# Patient Record
Sex: Male | Born: 1985 | Race: Black or African American | Hispanic: No | Marital: Single | Smoking: Current every day smoker
Health system: Southern US, Community
[De-identification: ages and names within clinical notes are randomized; demographics above are authoritative.]

## PROBLEM LIST (undated history)

## (undated) DIAGNOSIS — Y249XXA Unspecified firearm discharge, undetermined intent, initial encounter: Secondary | ICD-10-CM

## (undated) DIAGNOSIS — J45909 Unspecified asthma, uncomplicated: Secondary | ICD-10-CM

## (undated) DIAGNOSIS — S02609A Fracture of mandible, unspecified, initial encounter for closed fracture: Secondary | ICD-10-CM

## (undated) DIAGNOSIS — W3400XA Accidental discharge from unspecified firearms or gun, initial encounter: Secondary | ICD-10-CM

## (undated) DIAGNOSIS — R011 Cardiac murmur, unspecified: Secondary | ICD-10-CM

## (undated) DIAGNOSIS — I10 Essential (primary) hypertension: Secondary | ICD-10-CM

## (undated) DIAGNOSIS — F1910 Other psychoactive substance abuse, uncomplicated: Secondary | ICD-10-CM

## (undated) HISTORY — DX: Other psychoactive substance abuse, uncomplicated: F19.10

## (undated) HISTORY — PX: FRACTURE SURGERY: SHX138

## (undated) HISTORY — DX: Cardiac murmur, unspecified: R01.1

## (undated) HISTORY — PX: ABDOMINAL SURGERY: SHX537

---

## 2012-12-29 ENCOUNTER — Emergency Department (HOSPITAL_COMMUNITY): Payer: Medicaid - Out of State

## 2012-12-29 ENCOUNTER — Emergency Department (HOSPITAL_COMMUNITY)
Admission: EM | Admit: 2012-12-29 | Discharge: 2012-12-29 | Disposition: A | Discharge status: Home / Self Care | Payer: Medicaid - Out of State | Attending: Emergency Medicine | Admitting: Emergency Medicine

## 2012-12-29 ENCOUNTER — Encounter (HOSPITAL_COMMUNITY): Payer: Self-pay | Admitting: Emergency Medicine

## 2012-12-29 DIAGNOSIS — K089 Disorder of teeth and supporting structures, unspecified: Secondary | ICD-10-CM | POA: Insufficient documentation

## 2012-12-29 DIAGNOSIS — K0889 Other specified disorders of teeth and supporting structures: Secondary | ICD-10-CM

## 2012-12-29 DIAGNOSIS — Z8781 Personal history of (healed) traumatic fracture: Secondary | ICD-10-CM | POA: Insufficient documentation

## 2012-12-29 DIAGNOSIS — K047 Periapical abscess without sinus: Secondary | ICD-10-CM | POA: Insufficient documentation

## 2012-12-29 DIAGNOSIS — J45909 Unspecified asthma, uncomplicated: Secondary | ICD-10-CM | POA: Insufficient documentation

## 2012-12-29 DIAGNOSIS — R51 Headache: Secondary | ICD-10-CM | POA: Insufficient documentation

## 2012-12-29 DIAGNOSIS — F17200 Nicotine dependence, unspecified, uncomplicated: Secondary | ICD-10-CM | POA: Insufficient documentation

## 2012-12-29 HISTORY — DX: Fracture of mandible, unspecified, initial encounter for closed fracture: S02.609A

## 2012-12-29 HISTORY — DX: Unspecified asthma, uncomplicated: J45.909

## 2012-12-29 HISTORY — DX: Accidental discharge from unspecified firearms or gun, initial encounter: W34.00XA

## 2012-12-29 HISTORY — DX: Unspecified firearm discharge, undetermined intent, initial encounter: Y24.9XXA

## 2012-12-29 MED ORDER — OXYCODONE-ACETAMINOPHEN 5-325 MG PO TABS
2.0000 | ORAL_TABLET | Freq: Once | ORAL | Status: AC
  Administered 2012-12-29: 2 via ORAL
  Filled 2012-12-29: qty 2

## 2012-12-29 MED ORDER — CLINDAMYCIN HCL 300 MG PO CAPS: 300.0000 mg | ORAL_CAPSULE | Freq: Four times a day (QID) | ORAL | Status: DC

## 2012-12-29 MED ORDER — OXYCODONE-ACETAMINOPHEN 5-325 MG PO TABS: 1.0000 | ORAL_TABLET | Freq: Four times a day (QID) | ORAL | Status: DC | PRN
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
## 2012-12-30 NOTE — ED Provider Notes (Signed)
Medical screening examination/treatment/procedure(s) were performed by non-physician practitioner and as supervising physician I was immediately available for consultation/collaboration.   Lyanne Co, MD 12/30/12 [phone]

## 2013-03-20 ENCOUNTER — Encounter (HOSPITAL_COMMUNITY): Payer: Self-pay | Admitting: *Deleted

## 2013-03-20 ENCOUNTER — Emergency Department (HOSPITAL_COMMUNITY)
Admission: EM | Admit: 2013-03-20 | Discharge: 2013-03-20 | Disposition: A | Discharge status: Home / Self Care | Payer: Medicaid - Out of State | Source: Home / Self Care | Attending: Emergency Medicine | Admitting: Emergency Medicine

## 2013-03-20 ENCOUNTER — Emergency Department (HOSPITAL_COMMUNITY): Payer: Medicaid - Out of State

## 2013-03-20 ENCOUNTER — Encounter (HOSPITAL_COMMUNITY): Payer: Self-pay | Admitting: Emergency Medicine

## 2013-03-20 ENCOUNTER — Emergency Department (HOSPITAL_COMMUNITY)
Admission: EM | Admit: 2013-03-20 | Discharge: 2013-03-21 | Disposition: A | Discharge status: Home / Self Care | Payer: Medicaid - Out of State | Attending: Emergency Medicine | Admitting: Emergency Medicine

## 2013-03-20 DIAGNOSIS — J45909 Unspecified asthma, uncomplicated: Secondary | ICD-10-CM | POA: Insufficient documentation

## 2013-03-20 DIAGNOSIS — T148 Other injury of unspecified body region: Secondary | ICD-10-CM | POA: Insufficient documentation

## 2013-03-20 DIAGNOSIS — S93401A Sprain of unspecified ligament of right ankle, initial encounter: Secondary | ICD-10-CM

## 2013-03-20 DIAGNOSIS — Z87828 Personal history of other (healed) physical injury and trauma: Secondary | ICD-10-CM | POA: Insufficient documentation

## 2013-03-20 DIAGNOSIS — F17200 Nicotine dependence, unspecified, uncomplicated: Secondary | ICD-10-CM | POA: Insufficient documentation

## 2013-03-20 DIAGNOSIS — Y939 Activity, unspecified: Secondary | ICD-10-CM | POA: Insufficient documentation

## 2013-03-20 DIAGNOSIS — L02519 Cutaneous abscess of unspecified hand: Secondary | ICD-10-CM | POA: Insufficient documentation

## 2013-03-20 DIAGNOSIS — Z8781 Personal history of (healed) traumatic fracture: Secondary | ICD-10-CM | POA: Insufficient documentation

## 2013-03-20 DIAGNOSIS — L089 Local infection of the skin and subcutaneous tissue, unspecified: Secondary | ICD-10-CM | POA: Insufficient documentation

## 2013-03-20 DIAGNOSIS — L0390 Cellulitis, unspecified: Secondary | ICD-10-CM

## 2013-03-20 DIAGNOSIS — L03119 Cellulitis of unspecified part of limb: Secondary | ICD-10-CM | POA: Insufficient documentation

## 2013-03-20 DIAGNOSIS — Y929 Unspecified place or not applicable: Secondary | ICD-10-CM | POA: Insufficient documentation

## 2013-03-20 LAB — CBC WITH DIFFERENTIAL/PLATELET
Basophils Absolute: 0 10*3/uL (ref 0.0–0.1)
Basophils Relative: 1 % (ref 0–1)
MCHC: 33.9 g/dL (ref 30.0–36.0)
Monocytes Absolute: 0.5 10*3/uL (ref 0.1–1.0)
Neutro Abs: 3.8 10*3/uL (ref 1.7–7.7)
Neutrophils Relative %: 54 % (ref 43–77)
Platelets: 301 10*3/uL (ref 150–400)
RDW: 11.9 % (ref 11.5–15.5)

## 2013-03-20 LAB — BASIC METABOLIC PANEL
Chloride: 101 mEq/L (ref 96–112)
Creatinine, Ser: 0.66 mg/dL (ref 0.50–1.35)
GFR calc Af Amer: >90 mL/min (ref >90)
Potassium: 3.5 mEq/L (ref 3.5–5.1)
Sodium: 137 mEq/L (ref 135–145)

## 2013-03-20 MED ORDER — ONDANSETRON HCL 4 MG/2ML IJ SOLN
4.0000 mg | Freq: Once | INTRAMUSCULAR | Status: AC
  Administered 2013-03-20: 4 mg via INTRAVENOUS
  Filled 2013-03-20: qty 2

## 2013-03-20 MED ORDER — SODIUM CHLORIDE 0.9 % IV BOLUS (SEPSIS)
1000.0000 mL | Freq: Once | INTRAVENOUS | Status: AC
  Administered 2013-03-20: 1000 mL via INTRAVENOUS

## 2013-03-20 MED ORDER — SULFAMETHOXAZOLE-TRIMETHOPRIM 800-160 MG PO TABS: 1.0000 | ORAL_TABLET | Freq: Two times a day (BID) | ORAL | Status: DC

## 2013-03-20 MED ORDER — CLINDAMYCIN PHOSPHATE 900 MG/50ML IV SOLN
900.0000 mg | Freq: Once | INTRAVENOUS | Status: AC
  Administered 2013-03-20: 900 mg via INTRAVENOUS
  Filled 2013-03-20: qty 50

## 2013-03-20 MED ORDER — HYDROCODONE-ACETAMINOPHEN 5-325 MG PO TABS: 1.0000 | ORAL_TABLET | ORAL | Status: DC | PRN

## 2013-03-20 MED ORDER — HYDROCODONE-ACETAMINOPHEN 5-325 MG PO TABS
2.0000 | ORAL_TABLET | Freq: Once | ORAL | Status: AC
  Administered 2013-03-20: 2 via ORAL
  Filled 2013-03-20: qty 2

## 2013-03-20 MED ORDER — MORPHINE SULFATE 4 MG/ML IJ SOLN
4.0000 mg | Freq: Once | INTRAMUSCULAR | Status: AC
  Administered 2013-03-20: 4 mg via INTRAVENOUS
  Filled 2013-03-20: qty 1

## 2013-03-20 NOTE — Progress Notes (Signed)
Orthopedic Tech Progress Note Patient Details:  Shawn Arnold Feb 20, 1986 [id number]  Ortho Devices Type of Ortho Device: ASO Ortho Device/Splint Location: right ankle Ortho Device/Splint Interventions: Application   Shawn Arnold 03/20/2013, 5:10 PM
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
## 2013-03-21 ENCOUNTER — Emergency Department (HOSPITAL_COMMUNITY): Payer: Self-pay

## 2013-03-21 ENCOUNTER — Encounter (HOSPITAL_COMMUNITY): Payer: Self-pay | Admitting: Emergency Medicine

## 2013-03-21 ENCOUNTER — Emergency Department (HOSPITAL_COMMUNITY)
Admission: EM | Admit: 2013-03-21 | Discharge: 2013-03-21 | Disposition: A | Discharge status: Home / Self Care | Payer: Self-pay | Attending: Emergency Medicine | Admitting: Emergency Medicine

## 2013-03-21 DIAGNOSIS — L03114 Cellulitis of left upper limb: Secondary | ICD-10-CM

## 2013-03-21 DIAGNOSIS — R229 Localized swelling, mass and lump, unspecified: Secondary | ICD-10-CM | POA: Insufficient documentation

## 2013-03-21 DIAGNOSIS — J45909 Unspecified asthma, uncomplicated: Secondary | ICD-10-CM | POA: Insufficient documentation

## 2013-03-21 DIAGNOSIS — Y9289 Other specified places as the place of occurrence of the external cause: Secondary | ICD-10-CM | POA: Insufficient documentation

## 2013-03-21 DIAGNOSIS — Z8781 Personal history of (healed) traumatic fracture: Secondary | ICD-10-CM | POA: Insufficient documentation

## 2013-03-21 DIAGNOSIS — F17200 Nicotine dependence, unspecified, uncomplicated: Secondary | ICD-10-CM | POA: Insufficient documentation

## 2013-03-21 DIAGNOSIS — IMO0001 Reserved for inherently not codable concepts without codable children: Secondary | ICD-10-CM | POA: Insufficient documentation

## 2013-03-21 DIAGNOSIS — IMO0002 Reserved for concepts with insufficient information to code with codable children: Secondary | ICD-10-CM | POA: Insufficient documentation

## 2013-03-21 DIAGNOSIS — Y9389 Activity, other specified: Secondary | ICD-10-CM | POA: Insufficient documentation

## 2013-03-21 DIAGNOSIS — Z87828 Personal history of other (healed) physical injury and trauma: Secondary | ICD-10-CM | POA: Insufficient documentation

## 2013-03-21 MED ORDER — OXYCODONE-ACETAMINOPHEN 5-325 MG PO TABS
1.0000 | ORAL_TABLET | Freq: Once | ORAL | Status: DC
  Filled 2013-03-21: qty 1

## 2013-03-21 MED ORDER — CLINDAMYCIN HCL 150 MG PO CAPS: ORAL_CAPSULE | ORAL | Status: DC

## 2013-03-21 MED ORDER — OXYCODONE-ACETAMINOPHEN 5-325 MG PO TABS
2.0000 | ORAL_TABLET | Freq: Once | ORAL | Status: AC
  Administered 2013-03-21: 2 via ORAL
  Filled 2013-03-21: qty 1

## 2013-03-21 MED ORDER — CLINDAMYCIN PHOSPHATE 900 MG/50ML IV SOLN
900.0000 mg | Freq: Once | INTRAVENOUS | Status: AC
Start: 2013-03-21 — End: 2013-03-21
  Administered 2013-03-21: 900 mg via INTRAVENOUS
  Filled 2013-03-21: qty 50

## 2013-03-21 NOTE — ED Notes (Signed)
Patient to Xray as soon as he was roomed.
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
## 2013-03-21 NOTE — ED Notes (Signed)
Left forearm swollen and hot

## 2013-03-26 ENCOUNTER — Emergency Department (HOSPITAL_COMMUNITY)
Admission: EM | Admit: 2013-03-26 | Discharge: 2013-03-27 | Disposition: A | Discharge status: Home / Self Care | Payer: Medicaid - Out of State | Attending: Emergency Medicine | Admitting: Emergency Medicine

## 2013-03-26 ENCOUNTER — Encounter (HOSPITAL_COMMUNITY): Payer: Self-pay | Admitting: Emergency Medicine

## 2013-03-26 DIAGNOSIS — J45909 Unspecified asthma, uncomplicated: Secondary | ICD-10-CM | POA: Insufficient documentation

## 2013-03-26 DIAGNOSIS — L089 Local infection of the skin and subcutaneous tissue, unspecified: Secondary | ICD-10-CM | POA: Insufficient documentation

## 2013-03-26 DIAGNOSIS — Y929 Unspecified place or not applicable: Secondary | ICD-10-CM | POA: Insufficient documentation

## 2013-03-26 DIAGNOSIS — Y939 Activity, unspecified: Secondary | ICD-10-CM | POA: Insufficient documentation

## 2013-03-26 DIAGNOSIS — L03119 Cellulitis of unspecified part of limb: Secondary | ICD-10-CM | POA: Insufficient documentation

## 2013-03-26 DIAGNOSIS — F17200 Nicotine dependence, unspecified, uncomplicated: Secondary | ICD-10-CM | POA: Insufficient documentation

## 2013-03-26 DIAGNOSIS — Z8781 Personal history of (healed) traumatic fracture: Secondary | ICD-10-CM | POA: Insufficient documentation

## 2013-03-26 DIAGNOSIS — L02419 Cutaneous abscess of limb, unspecified: Secondary | ICD-10-CM | POA: Insufficient documentation

## 2013-03-26 DIAGNOSIS — Z87828 Personal history of other (healed) physical injury and trauma: Secondary | ICD-10-CM | POA: Insufficient documentation

## 2013-03-26 NOTE — ED Notes (Addendum)
Reports ? Insect bite to posterior R lower leg since earlier today. States it has now turned into a blister.

## 2013-03-27 MED ORDER — CLINDAMYCIN HCL 150 MG PO CAPS: 300.0000 mg | ORAL_CAPSULE | Freq: Four times a day (QID) | ORAL | Status: DC

## 2013-03-27 MED ORDER — CLINDAMYCIN HCL 150 MG PO CAPS: 150.0000 mg | ORAL_CAPSULE | Freq: Four times a day (QID) | ORAL | Status: DC

## 2013-03-27 MED ORDER — CLINDAMYCIN HCL 150 MG PO CAPS
300.0000 mg | ORAL_CAPSULE | Freq: Once | ORAL | Status: AC
  Administered 2013-03-27: 300 mg via ORAL
  Filled 2013-03-27: qty 2

## 2013-03-27 MED ORDER — ONDANSETRON HCL 4 MG PO TABS: 4.0000 mg | ORAL_TABLET | Freq: Four times a day (QID) | ORAL | Status: DC
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
## 2013-03-27 NOTE — ED Provider Notes (Signed)
Medical screening examination/treatment/procedure(s) were performed by non-physician practitioner and as supervising physician I was immediately available for consultation/collaboration.   Julie Manly, MD 03/27/13 0809 

## 2013-03-31 ENCOUNTER — Encounter (HOSPITAL_COMMUNITY): Payer: Self-pay | Admitting: Emergency Medicine

## 2013-03-31 ENCOUNTER — Emergency Department (HOSPITAL_COMMUNITY): Payer: Medicaid - Out of State

## 2013-03-31 ENCOUNTER — Emergency Department (HOSPITAL_COMMUNITY)
Admission: EM | Admit: 2013-03-31 | Discharge: 2013-03-31 | Disposition: A | Discharge status: Home / Self Care | Payer: Medicaid - Out of State | Attending: Emergency Medicine | Admitting: Emergency Medicine

## 2013-03-31 DIAGNOSIS — L03211 Cellulitis of face: Secondary | ICD-10-CM | POA: Insufficient documentation

## 2013-03-31 DIAGNOSIS — Z87828 Personal history of other (healed) physical injury and trauma: Secondary | ICD-10-CM | POA: Insufficient documentation

## 2013-03-31 DIAGNOSIS — K029 Dental caries, unspecified: Secondary | ICD-10-CM | POA: Insufficient documentation

## 2013-03-31 DIAGNOSIS — K089 Disorder of teeth and supporting structures, unspecified: Secondary | ICD-10-CM | POA: Insufficient documentation

## 2013-03-31 DIAGNOSIS — Z8781 Personal history of (healed) traumatic fracture: Secondary | ICD-10-CM | POA: Insufficient documentation

## 2013-03-31 DIAGNOSIS — L0201 Cutaneous abscess of face: Secondary | ICD-10-CM | POA: Insufficient documentation

## 2013-03-31 DIAGNOSIS — J45909 Unspecified asthma, uncomplicated: Secondary | ICD-10-CM | POA: Insufficient documentation

## 2013-03-31 DIAGNOSIS — F17200 Nicotine dependence, unspecified, uncomplicated: Secondary | ICD-10-CM | POA: Insufficient documentation

## 2013-03-31 DIAGNOSIS — K0889 Other specified disorders of teeth and supporting structures: Secondary | ICD-10-CM

## 2013-03-31 MED ORDER — PROMETHAZINE HCL 25 MG PO TABS: 25.0000 mg | ORAL_TABLET | Freq: Four times a day (QID) | ORAL | Status: DC | PRN

## 2013-03-31 MED ORDER — OXYCODONE-ACETAMINOPHEN 5-325 MG PO TABS: 2.0000 | ORAL_TABLET | ORAL | Status: DC | PRN

## 2013-03-31 MED ORDER — PENICILLIN V POTASSIUM 250 MG PO TABS
500.0000 mg | ORAL_TABLET | Freq: Once | ORAL | Status: AC
  Administered 2013-03-31: 500 mg via ORAL
  Filled 2013-03-31: qty 2

## 2013-03-31 MED ORDER — OXYCODONE-ACETAMINOPHEN 5-325 MG PO TABS
2.0000 | ORAL_TABLET | Freq: Once | ORAL | Status: AC
  Administered 2013-03-31: 2 via ORAL
  Filled 2013-03-31: qty 2

## 2013-03-31 MED ORDER — MORPHINE SULFATE 4 MG/ML IJ SOLN
4.0000 mg | Freq: Once | INTRAMUSCULAR | Status: AC
  Administered 2013-03-31: 4 mg via INTRAVENOUS
  Filled 2013-03-31: qty 1

## 2013-03-31 MED ORDER — PENICILLIN V POTASSIUM 500 MG PO TABS: 500.0000 mg | ORAL_TABLET | Freq: Four times a day (QID) | ORAL | Status: DC

## 2013-03-31 MED ORDER — IOHEXOL 300 MG/ML  SOLN
80.0000 mL | Freq: Once | INTRAMUSCULAR | Status: AC | PRN
  Administered 2013-03-31: 80 mL via INTRAVENOUS

## 2013-03-31 MED ORDER — ONDANSETRON HCL 4 MG/2ML IJ SOLN
4.0000 mg | Freq: Once | INTRAMUSCULAR | Status: AC
  Administered 2013-03-31: 4 mg via INTRAVENOUS
  Filled 2013-03-31: qty 2

## 2013-03-31 NOTE — ED Notes (Signed)
L lower toothache x 1 week with swelling over the past 2 days.
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
## 2013-04-02 NOTE — ED Provider Notes (Signed)
Medical screening examination/treatment/procedure(s) were performed by non-physician practitioner and as supervising physician I was immediately available for consultation/collaboration.   Laray Anger, DO 04/02/13 1436

## 2013-09-26 ENCOUNTER — Emergency Department (HOSPITAL_COMMUNITY)
Admission: EM | Admit: 2013-09-26 | Discharge: 2013-09-26 | Disposition: A | Discharge status: Home / Self Care | Payer: Medicaid - Out of State | Attending: Emergency Medicine | Admitting: Emergency Medicine

## 2013-09-26 ENCOUNTER — Emergency Department (HOSPITAL_COMMUNITY): Payer: Medicaid - Out of State

## 2013-09-26 ENCOUNTER — Encounter (HOSPITAL_COMMUNITY): Payer: Self-pay | Admitting: Emergency Medicine

## 2013-09-26 DIAGNOSIS — S60221A Contusion of right hand, initial encounter: Secondary | ICD-10-CM

## 2013-09-26 DIAGNOSIS — Z8781 Personal history of (healed) traumatic fracture: Secondary | ICD-10-CM | POA: Insufficient documentation

## 2013-09-26 DIAGNOSIS — IMO0002 Reserved for concepts with insufficient information to code with codable children: Secondary | ICD-10-CM | POA: Insufficient documentation

## 2013-09-26 DIAGNOSIS — F17200 Nicotine dependence, unspecified, uncomplicated: Secondary | ICD-10-CM | POA: Insufficient documentation

## 2013-09-26 DIAGNOSIS — J45909 Unspecified asthma, uncomplicated: Secondary | ICD-10-CM | POA: Insufficient documentation

## 2013-09-26 DIAGNOSIS — S60229A Contusion of unspecified hand, initial encounter: Secondary | ICD-10-CM | POA: Insufficient documentation
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
## 2013-09-26 NOTE — ED Notes (Signed)
Pharmacy at bedside

## 2013-09-26 NOTE — ED Notes (Signed)
Patient transported to X-ray 
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
## 2013-09-27 NOTE — ED Provider Notes (Signed)
Medical screening examination/treatment/procedure(s) were performed by non-physician practitioner and as supervising physician I was immediately available for consultation/collaboration.  EKG Interpretation   None         Jamaia Brum, MD 09/27/13 0729 

## 2013-10-23 ENCOUNTER — Encounter (HOSPITAL_COMMUNITY): Payer: Self-pay | Admitting: Emergency Medicine

## 2013-10-23 ENCOUNTER — Emergency Department (HOSPITAL_COMMUNITY): Payer: Medicaid - Out of State

## 2013-10-23 DIAGNOSIS — J45909 Unspecified asthma, uncomplicated: Secondary | ICD-10-CM | POA: Insufficient documentation

## 2013-10-23 DIAGNOSIS — S8990XA Unspecified injury of unspecified lower leg, initial encounter: Secondary | ICD-10-CM | POA: Insufficient documentation

## 2013-10-23 DIAGNOSIS — Y939 Activity, unspecified: Secondary | ICD-10-CM | POA: Insufficient documentation

## 2013-10-23 DIAGNOSIS — Y929 Unspecified place or not applicable: Secondary | ICD-10-CM | POA: Insufficient documentation

## 2013-10-23 DIAGNOSIS — R209 Unspecified disturbances of skin sensation: Secondary | ICD-10-CM | POA: Insufficient documentation

## 2013-10-23 DIAGNOSIS — Z8781 Personal history of (healed) traumatic fracture: Secondary | ICD-10-CM | POA: Insufficient documentation

## 2013-10-23 DIAGNOSIS — W2209XA Striking against other stationary object, initial encounter: Secondary | ICD-10-CM | POA: Insufficient documentation

## 2013-10-23 DIAGNOSIS — F17200 Nicotine dependence, unspecified, uncomplicated: Secondary | ICD-10-CM | POA: Insufficient documentation

## 2013-10-23 NOTE — ED Notes (Signed)
Pt states that he woke up a couple of days ago and his right ankle was in pain. Pt states that he thought moving and walking on the right ankle would help with the pain. Pt ambulatory.

## 2013-10-24 ENCOUNTER — Emergency Department (HOSPITAL_COMMUNITY)
Admission: EM | Admit: 2013-10-24 | Discharge: 2013-10-24 | Disposition: A | Discharge status: Home / Self Care | Payer: Medicaid - Out of State | Attending: Emergency Medicine | Admitting: Emergency Medicine

## 2013-10-24 DIAGNOSIS — M25571 Pain in right ankle and joints of right foot: Secondary | ICD-10-CM

## 2013-10-24 MED ORDER — OXYCODONE-ACETAMINOPHEN 5-325 MG PO TABS
2.0000 | ORAL_TABLET | Freq: Once | ORAL | Status: AC
  Administered 2013-10-24: 2 via ORAL
  Filled 2013-10-24: qty 2

## 2013-10-24 MED ORDER — IBUPROFEN 600 MG PO TABS: 600.0000 mg | ORAL_TABLET | Freq: Four times a day (QID) | ORAL | Status: DC | PRN

## 2013-10-24 MED ORDER — TRAMADOL HCL 50 MG PO TABS: 50.0000 mg | ORAL_TABLET | Freq: Four times a day (QID) | ORAL | Status: DC | PRN

## 2013-10-24 NOTE — ED Provider Notes (Signed)
Medical screening examination/treatment/procedure(s) were performed by non-physician practitioner and as supervising physician I was immediately available for consultation/collaboration.  EKG Interpretation   None        Sunnie Nielsen, MD 10/24/13 [phone]
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
## 2014-02-20 ENCOUNTER — Encounter (HOSPITAL_COMMUNITY): Payer: Self-pay | Admitting: Emergency Medicine

## 2014-02-20 ENCOUNTER — Emergency Department (HOSPITAL_COMMUNITY)
Admission: EM | Admit: 2014-02-20 | Discharge: 2014-02-20 | Disposition: A | Discharge status: Home / Self Care | Payer: Medicaid - Out of State | Attending: Emergency Medicine | Admitting: Emergency Medicine

## 2014-02-20 DIAGNOSIS — K0889 Other specified disorders of teeth and supporting structures: Secondary | ICD-10-CM

## 2014-02-20 DIAGNOSIS — J45909 Unspecified asthma, uncomplicated: Secondary | ICD-10-CM | POA: Insufficient documentation

## 2014-02-20 DIAGNOSIS — R42 Dizziness and giddiness: Secondary | ICD-10-CM | POA: Insufficient documentation

## 2014-02-20 DIAGNOSIS — K029 Dental caries, unspecified: Secondary | ICD-10-CM | POA: Insufficient documentation

## 2014-02-20 DIAGNOSIS — Z8781 Personal history of (healed) traumatic fracture: Secondary | ICD-10-CM | POA: Insufficient documentation

## 2014-02-20 DIAGNOSIS — K089 Disorder of teeth and supporting structures, unspecified: Secondary | ICD-10-CM | POA: Insufficient documentation

## 2014-02-20 DIAGNOSIS — Z87828 Personal history of other (healed) physical injury and trauma: Secondary | ICD-10-CM | POA: Insufficient documentation

## 2014-02-20 DIAGNOSIS — F17200 Nicotine dependence, unspecified, uncomplicated: Secondary | ICD-10-CM | POA: Insufficient documentation

## 2014-02-20 DIAGNOSIS — I10 Essential (primary) hypertension: Secondary | ICD-10-CM | POA: Insufficient documentation

## 2014-02-20 HISTORY — DX: Essential (primary) hypertension: I10

## 2014-02-20 MED ORDER — PENICILLIN V POTASSIUM 500 MG PO TABS: 500.0000 mg | ORAL_TABLET | Freq: Four times a day (QID) | ORAL | Status: DC

## 2014-02-20 MED ORDER — HYDROCODONE-ACETAMINOPHEN 5-325 MG PO TABS: 1.0000 | ORAL_TABLET | Freq: Four times a day (QID) | ORAL | Status: DC | PRN
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
## 2014-02-20 NOTE — Discharge Instructions (Signed)
Return here as needed.  Over the dentist provided.  Rinse with warm water and peroxide 3 times a day

## 2014-02-20 NOTE — ED Provider Notes (Signed)
Medical screening examination/treatment/procedure(s) were performed by non-physician practitioner and as supervising physician I was immediately available for consultation/collaboration.   EKG Interpretation None      Karlis Cregg, MD, FACEP   Clayten Allcock L Tywan Siever, MD 02/20/14 2258 

## 2014-02-20 NOTE — ED Notes (Signed)
Received Pt from home c/o left upper toothache onset intermittent x 5 months, increased yesterday. Pt tried Motrin no relief.

## 2014-12-22 IMAGING — CR DG FOREARM 2V*L*
2 series · 2 of 2 positions shown · non-contrast
Comparison: None.

CLINICAL DATA: Forearm pain/swelling

LEFT FOREARM - 2 VIEW

[x forearm ap left]
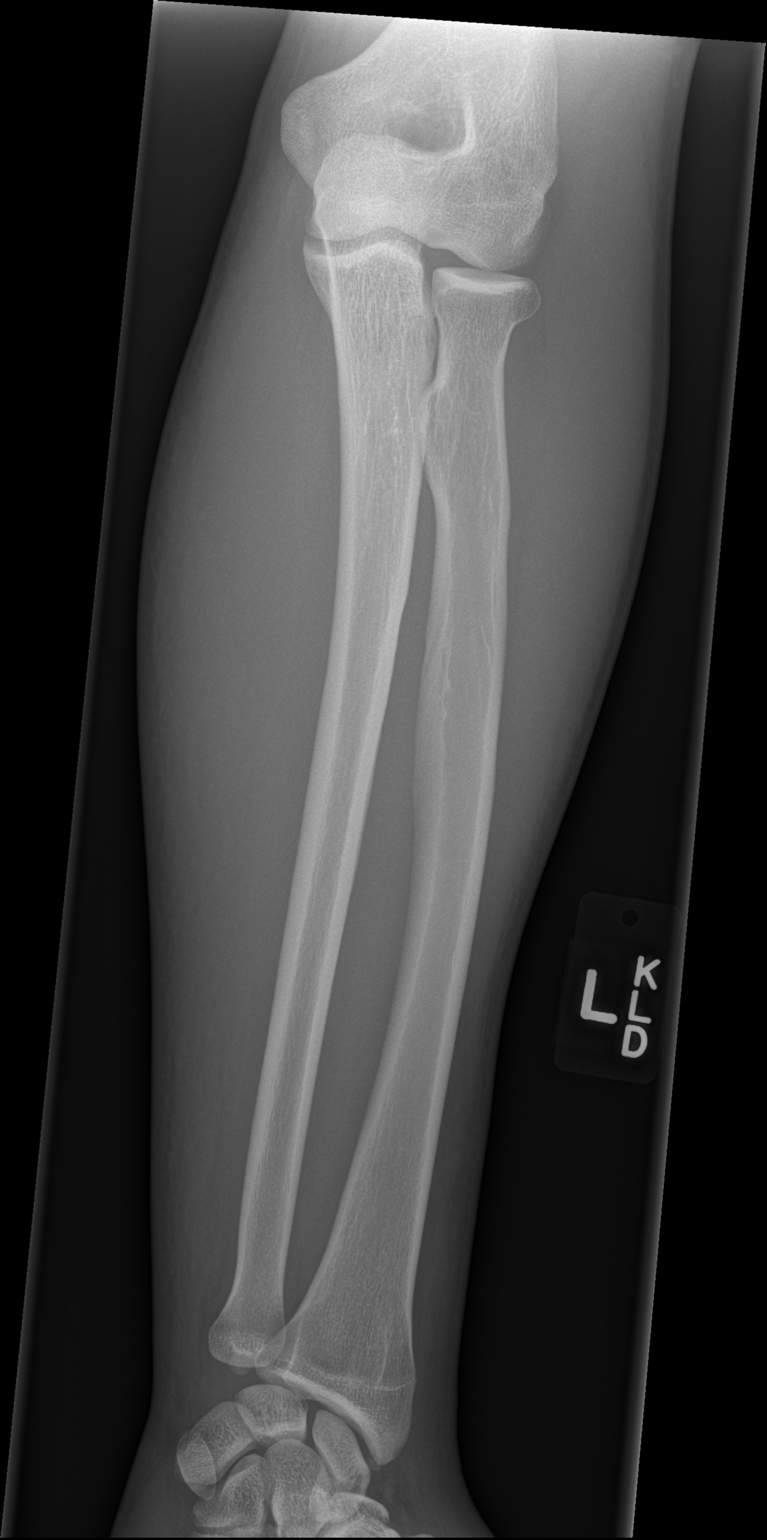

[x forearm lat left]
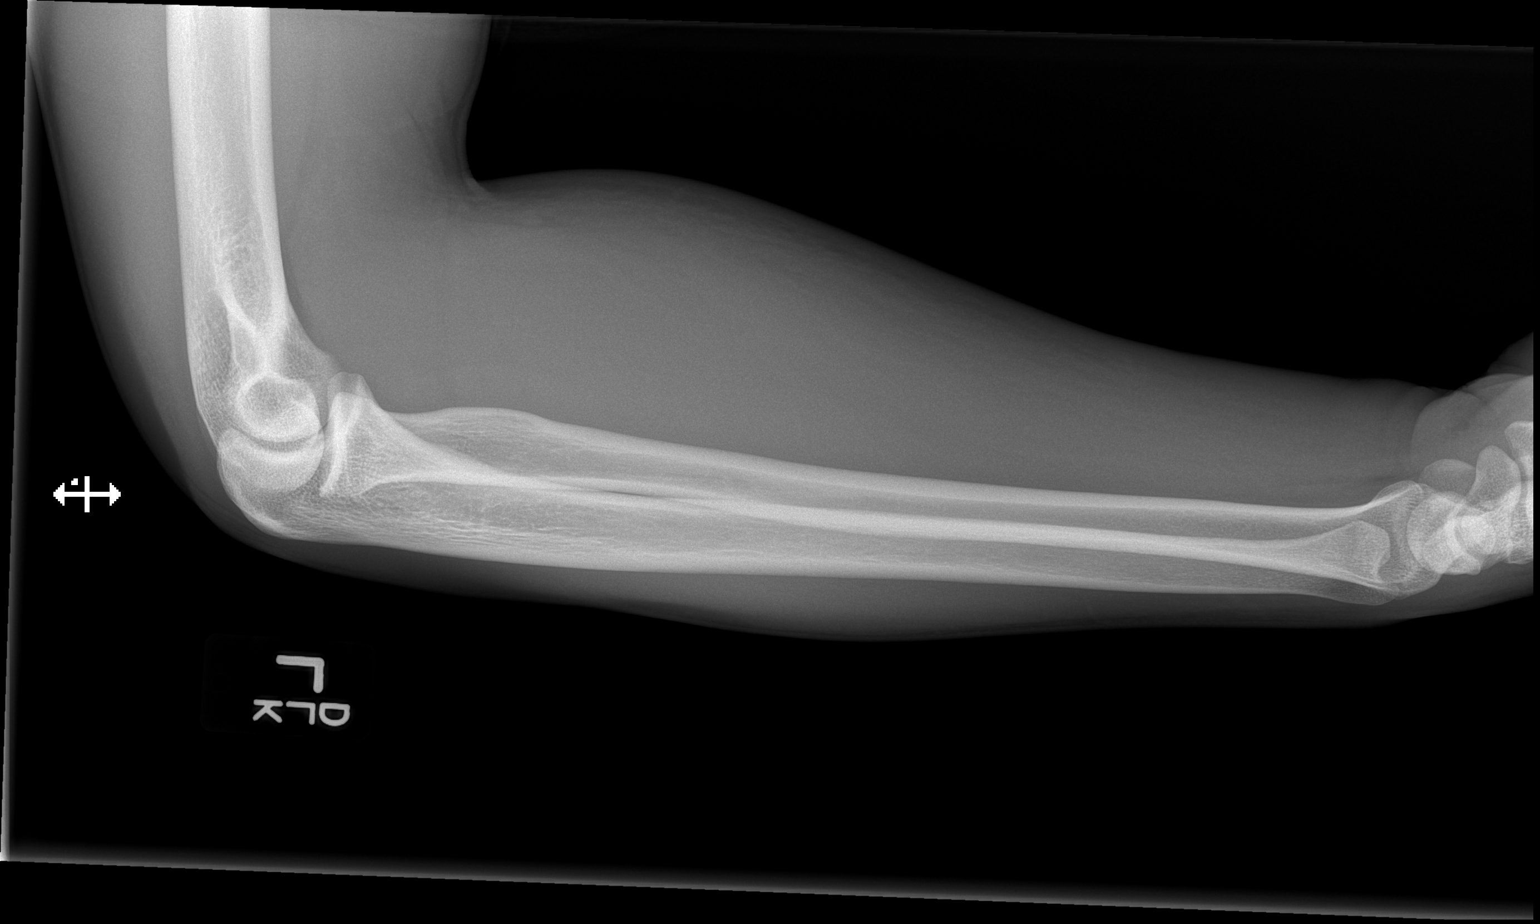

[2 of 2 positions shown; findings below may reference images not displayed]

FINDINGS: No fracture or dislocation is seen.

The joint spaces are preserved.

No radiopaque foreign body is seen.
IMPRESSION: No fracture, dislocation, or radiopaque foreign body is seen.

## 2014-12-25 ENCOUNTER — Encounter (HOSPITAL_COMMUNITY): Payer: Self-pay | Admitting: *Deleted

## 2014-12-25 ENCOUNTER — Emergency Department (HOSPITAL_COMMUNITY)
Admission: EM | Admit: 2014-12-25 | Discharge: 2014-12-25 | Disposition: A | Discharge status: Home / Self Care | Payer: Medicaid - Out of State | Attending: Emergency Medicine | Admitting: Emergency Medicine

## 2014-12-25 DIAGNOSIS — M545 Low back pain: Secondary | ICD-10-CM

## 2014-12-25 DIAGNOSIS — J45909 Unspecified asthma, uncomplicated: Secondary | ICD-10-CM | POA: Insufficient documentation

## 2014-12-25 DIAGNOSIS — Z720 Tobacco use: Secondary | ICD-10-CM | POA: Insufficient documentation

## 2014-12-25 DIAGNOSIS — M5440 Lumbago with sciatica, unspecified side: Secondary | ICD-10-CM | POA: Insufficient documentation

## 2014-12-25 DIAGNOSIS — Z8781 Personal history of (healed) traumatic fracture: Secondary | ICD-10-CM | POA: Insufficient documentation

## 2014-12-25 DIAGNOSIS — Z87828 Personal history of other (healed) physical injury and trauma: Secondary | ICD-10-CM | POA: Insufficient documentation

## 2014-12-25 DIAGNOSIS — I10 Essential (primary) hypertension: Secondary | ICD-10-CM | POA: Insufficient documentation

## 2014-12-25 MED ORDER — KETOROLAC TROMETHAMINE 60 MG/2ML IM SOLN
30.0000 mg | Freq: Once | INTRAMUSCULAR | Status: AC
  Administered 2014-12-25: 30 mg via INTRAMUSCULAR
  Filled 2014-12-25: qty 2

## 2014-12-25 MED ORDER — METHOCARBAMOL 500 MG PO TABS: 500.0000 mg | ORAL_TABLET | Freq: Two times a day (BID) | ORAL | Status: DC

## 2014-12-25 MED ORDER — DIAZEPAM 5 MG/ML IJ SOLN
5.0000 mg | Freq: Once | INTRAMUSCULAR | Status: AC
  Administered 2014-12-25: 5 mg via INTRAMUSCULAR
  Filled 2014-12-25: qty 2

## 2014-12-25 MED ORDER — TRAMADOL HCL 50 MG PO TABS: 50.0000 mg | ORAL_TABLET | Freq: Four times a day (QID) | ORAL | Status: DC | PRN

## 2014-12-25 NOTE — ED Notes (Signed)
Called jen, pa, to clarify dosage of toradol, patient is to receive 30mg  of toradol IM for pain management.

## 2014-12-25 NOTE — Discharge Instructions (Signed)
Please follow up with your primary care physician in 1-2 days. If you do not have one please call the [facility] number listed above. Please take pain medication and/or muscle relaxants as prescribed and as needed for pain. Please do not drive on narcotic pain medication or on muscle relaxants. Please read all discharge instructions and return precautions.  ° °Back Pain, Adult °Low back pain is very common. About 1 in 5 people have back pain. The cause of low back pain is rarely dangerous. The pain often gets better over time. About half of people with a sudden onset of back pain feel better in just 2 weeks. About 8 in 10 people feel better by 6 weeks.  °CAUSES °Some common causes of back pain include: °· Strain of the muscles or ligaments supporting the spine. °· Wear and tear (degeneration) of the spinal discs. °· Arthritis. °· Direct injury to the back. °DIAGNOSIS °Most of the time, the direct cause of low back pain is not known. However, back pain can be treated effectively even when the exact cause of the pain is unknown. Answering your caregiver's questions about your overall health and symptoms is one of the most accurate ways to make sure the cause of your pain is not dangerous. If your caregiver needs more information, he or she may order lab work or imaging tests (X-rays or MRIs). However, even if imaging tests show changes in your back, this usually does not require surgery. °HOME CARE INSTRUCTIONS °For many people, back pain returns. Since low back pain is rarely dangerous, it is often a condition that people can learn to manage on their own.  °· Remain active. It is stressful on the back to sit or stand in one place. Do not sit, drive, or stand in one place for more than 30 minutes at a time. Take short walks on level surfaces as soon as pain allows. Try to increase the length of time you walk each day. °· Do not stay in bed. Resting more than 1 or 2 days can delay your  recovery. °· Do not avoid exercise or work. Your body is made to move. It is not dangerous to be active, even though your back may hurt. Your back will likely heal faster if you return to being active before your pain is gone. °· Pay attention to your body when you  bend and lift. Many people have less discomfort when lifting if they bend their knees, keep the load close to their bodies, and avoid twisting. Often, the most comfortable positions are those that put less stress on your recovering back. °· Find a comfortable position to sleep. Use a firm mattress and lie on your side with your knees slightly bent. If you lie on your back, put a pillow under your knees. °· Only take over-the-counter or prescription medicines as directed by your caregiver. Over-the-counter medicines to reduce pain and inflammation are often the most helpful. Your caregiver may prescribe muscle relaxant drugs. These medicines help dull your pain so you can more quickly return to your normal activities and healthy exercise. °· Put ice on the injured area. °¨ Put ice in a plastic bag. °¨ Place a towel between your skin and the bag. °¨ Leave the ice on for 15-20 minutes, 03-04 times a day for the first 2 to 3 days. After that, ice and heat may be alternated to reduce pain and spasms. °· Ask your caregiver about trying back exercises and gentle massage. This may be of some benefit. °· Avoid feeling anxious or stressed. Stress increases muscle tension and   can worsen back pain. It is important to recognize when you are anxious or stressed and learn ways to manage it. Exercise is a great option. °SEEK MEDICAL CARE IF: °· You have pain that is not relieved with rest or medicine. °· You have pain that does not improve in 1 week. °· You have new symptoms. °· You are generally not feeling well. °SEEK IMMEDIATE MEDICAL CARE IF:  °· You have pain that radiates from your back into your legs. °· You develop new bowel or bladder control problems. °· You  have unusual weakness or numbness in your arms or legs. °· You develop nausea or vomiting. °· You develop abdominal pain. °· You feel faint. °Document Released: 10/14/2005 Document Revised: 04/14/2012 Document Reviewed: 02/15/2014 °[employer]® Patient Information ©2015 [employer]. This information is not intended to replace advice given to you by your health care provider. Make sure you discuss any questions you have with your health care provider. ° °
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
## 2015-06-29 IMAGING — CR DG HAND COMPLETE 3+V*R*
3 series · 3 of 3 positions shown · non-contrast
Comparison: None.

CLINICAL DATA: Left hand pain along 1st metatarsal

EXAM:
RIGHT HAND - COMPLETE 3+ VIEW

[x hand pa right]
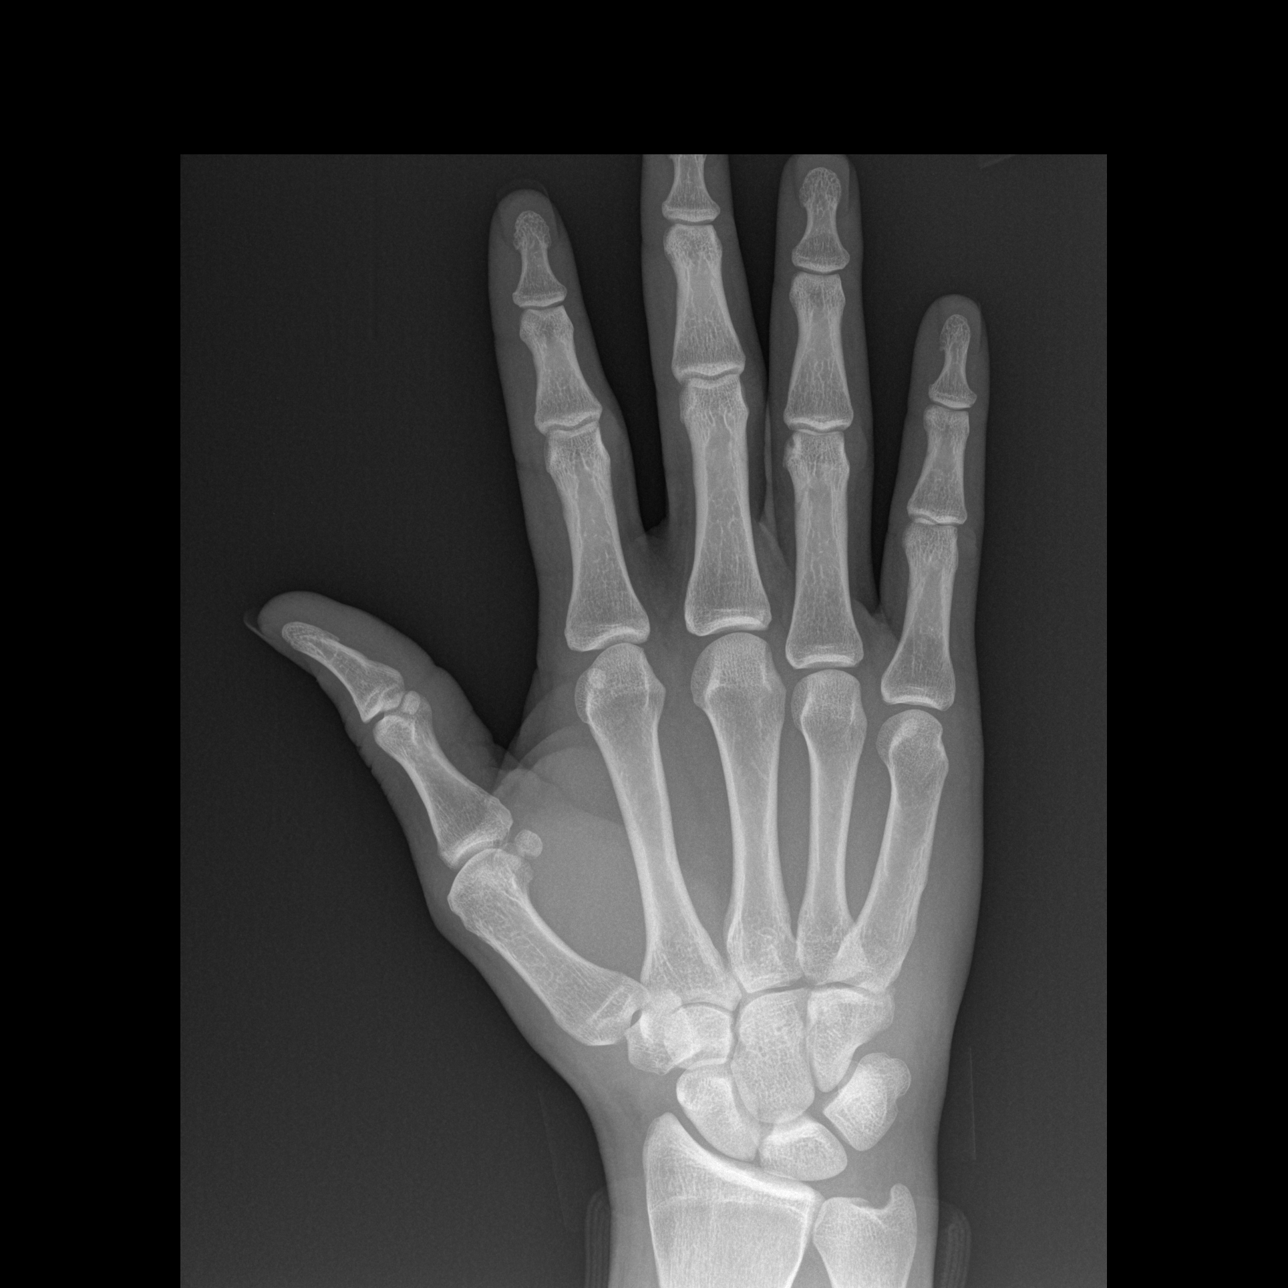

[x hand oblique right]
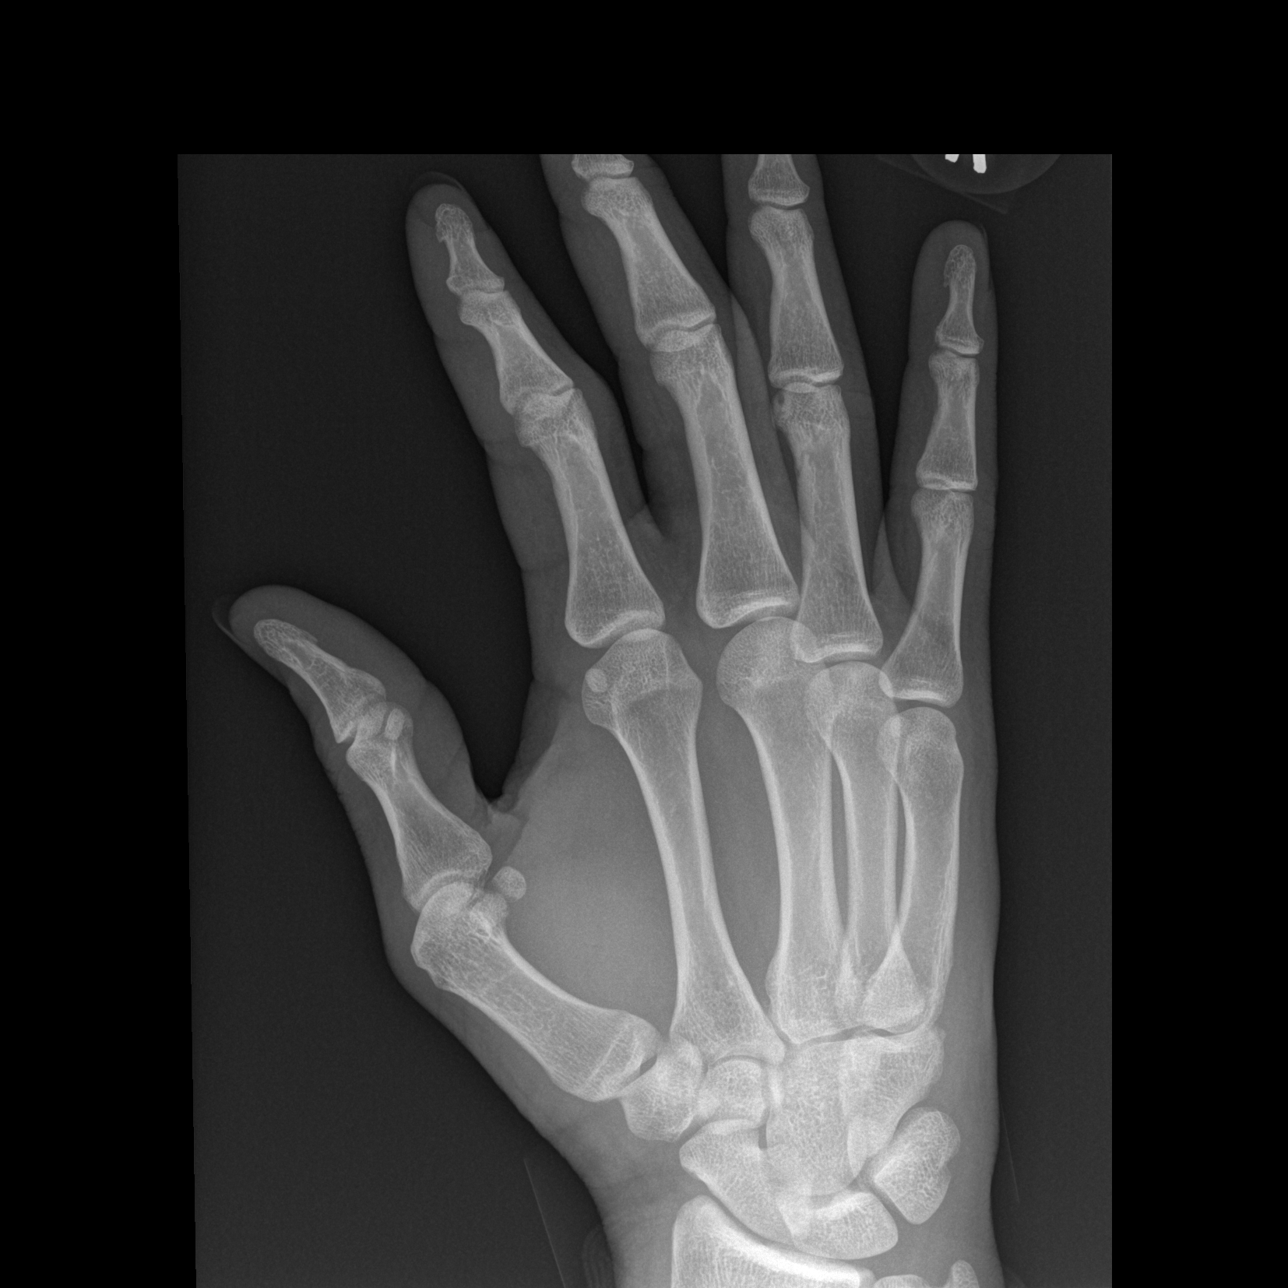

[x hand lat right]
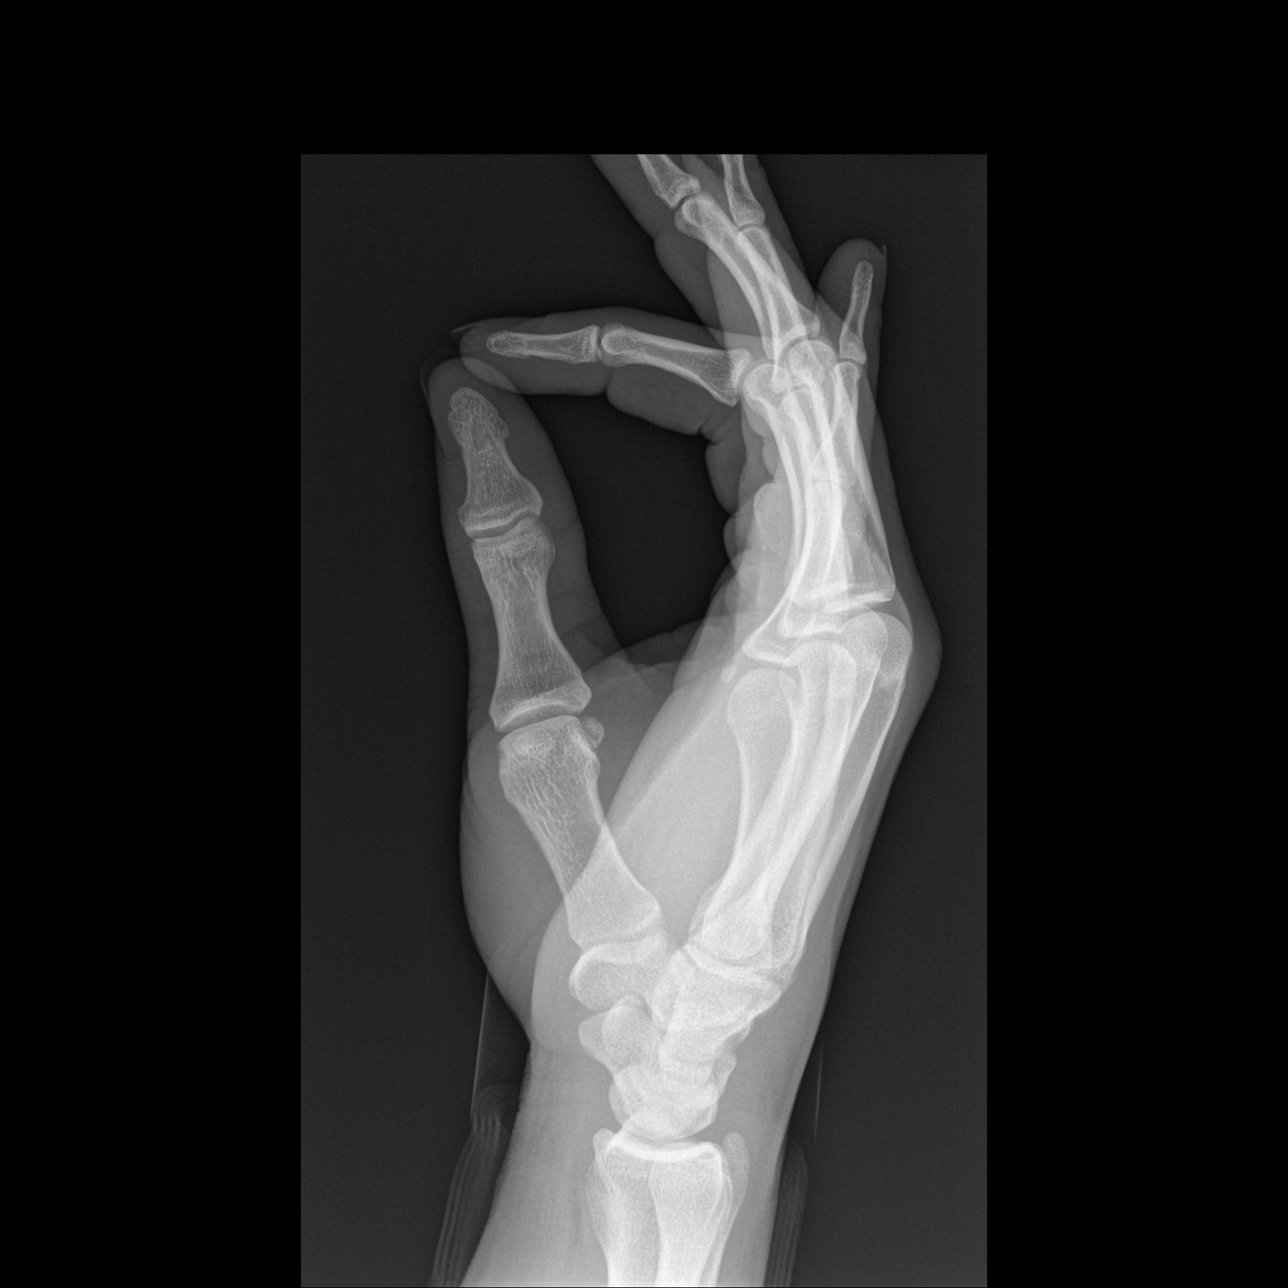

[3 of 3 positions shown; findings below may reference images not displayed]

FINDINGS: No fracture or dislocation is seen.

The joint spaces are preserved.

The visualized soft tissues are unremarkable.
IMPRESSION: No fracture or dislocation is seen.

## 2016-09-21 ENCOUNTER — Emergency Department
Admission: EM | Admit: 2016-09-21 | Discharge: 2016-09-21 | Disposition: A | Discharge status: Home / Self Care | Payer: Self-pay | Attending: Emergency Medicine | Admitting: Emergency Medicine

## 2016-09-21 ENCOUNTER — Emergency Department: Payer: Self-pay

## 2016-09-21 ENCOUNTER — Encounter: Payer: Self-pay | Admitting: Emergency Medicine

## 2016-09-21 DIAGNOSIS — I10 Essential (primary) hypertension: Secondary | ICD-10-CM | POA: Insufficient documentation

## 2016-09-21 DIAGNOSIS — R1084 Generalized abdominal pain: Secondary | ICD-10-CM | POA: Insufficient documentation

## 2016-09-21 DIAGNOSIS — F17200 Nicotine dependence, unspecified, uncomplicated: Secondary | ICD-10-CM | POA: Insufficient documentation

## 2016-09-21 DIAGNOSIS — Z79899 Other long term (current) drug therapy: Secondary | ICD-10-CM | POA: Insufficient documentation

## 2016-09-21 DIAGNOSIS — J45909 Unspecified asthma, uncomplicated: Secondary | ICD-10-CM | POA: Insufficient documentation

## 2016-09-21 LAB — URINALYSIS COMPLETE WITH MICROSCOPIC (ARMC ONLY)
Bacteria, UA: NONE SEEN
Bilirubin Urine: NEGATIVE
Glucose, UA: NEGATIVE mg/dL
Hgb urine dipstick: NEGATIVE
Ketones, ur: NEGATIVE mg/dL
Nitrite: NEGATIVE
Protein, ur: NEGATIVE mg/dL
SQUAMOUS EPITHELIAL / LPF: NONE SEEN
Specific Gravity, Urine: 1.021 (ref 1.005–1.030)
pH: 5 (ref 5.0–8.0)

## 2016-09-21 LAB — LIPASE, BLOOD: LIPASE: 37 U/L (ref 11–51)

## 2016-09-21 LAB — COMPREHENSIVE METABOLIC PANEL
ALT: 11 U/L — ABNORMAL LOW (ref 17–63)
AST: 16 U/L (ref 15–41)
Albumin: 4.4 g/dL (ref 3.5–5.0)
Alkaline Phosphatase: 55 U/L (ref 38–126)
Anion gap: 9 (ref 5–15)
BUN: 9 mg/dL (ref 6–20)
CHLORIDE: 109 mmol/L (ref 101–111)
CO2: 23 mmol/L (ref 22–32)
Calcium: 9.1 mg/dL (ref 8.9–10.3)
Creatinine, Ser: 0.9 mg/dL (ref 0.61–1.24)
GFR calc Af Amer: >60 mL/min (ref >60)
GFR calc non Af Amer: >60 mL/min (ref >60)
GLUCOSE: 99 mg/dL (ref 65–99)
Potassium: 3.5 mmol/L (ref 3.5–5.1)
Sodium: 141 mmol/L (ref 135–145)
Total Bilirubin: 0.4 mg/dL (ref 0.3–1.2)
Total Protein: 7.6 g/dL (ref 6.5–8.1)

## 2016-09-21 LAB — CBC
HCT: 41.6 % (ref 40.0–52.0)
Hemoglobin: 14 g/dL (ref 13.0–18.0)
MCH: 33.5 pg (ref 26.0–34.0)
MCHC: 33.8 g/dL (ref 32.0–36.0)
MCV: 99.1 fL (ref 80.0–100.0)
PLATELETS: 283 10*3/uL (ref 150–440)
RBC: 4.2 MIL/uL — AB (ref 4.40–5.90)
RDW: 12.4 % (ref 11.5–14.5)
WBC: 6 10*3/uL (ref 3.8–10.6)

## 2016-09-21 MED ORDER — OXYCODONE-ACETAMINOPHEN 5-325 MG PO TABS
2.0000 | ORAL_TABLET | Freq: Once | ORAL | Status: AC
  Administered 2016-09-21: 2 via ORAL

## 2016-09-21 MED ORDER — ONDANSETRON HCL 4 MG/2ML IJ SOLN
INTRAMUSCULAR | Status: AC
  Filled 2016-09-21: qty 2

## 2016-09-21 MED ORDER — MORPHINE SULFATE (PF) 4 MG/ML IV SOLN: 4.0000 mg | Freq: Once | INTRAVENOUS | Status: DC

## 2016-09-21 MED ORDER — MORPHINE SULFATE (PF) 4 MG/ML IV SOLN
INTRAVENOUS | Status: AC
  Filled 2016-09-21: qty 1

## 2016-09-21 MED ORDER — ONDANSETRON 4 MG PO TBDP: ORAL_TABLET | ORAL | 0 refills | Status: DC

## 2016-09-21 MED ORDER — OXYCODONE-ACETAMINOPHEN 5-325 MG PO TABS
ORAL_TABLET | ORAL | Status: AC
  Filled 2016-09-21: qty 2

## 2016-09-21 MED ORDER — IOPAMIDOL (ISOVUE-300) INJECTION 61%
30.0000 mL | Freq: Once | INTRAVENOUS | Status: AC | PRN
  Administered 2016-09-21: 30 mL via ORAL

## 2016-09-21 MED ORDER — IOPAMIDOL (ISOVUE-300) INJECTION 61%
100.0000 mL | Freq: Once | INTRAVENOUS | Status: AC | PRN
  Administered 2016-09-21: 100 mL via INTRAVENOUS

## 2016-09-21 MED ORDER — DOCUSATE SODIUM 100 MG PO CAPS: ORAL_CAPSULE | ORAL | 0 refills | Status: DC

## 2016-09-21 MED ORDER — HYDROCODONE-ACETAMINOPHEN 5-325 MG PO TABS: 1.0000 | ORAL_TABLET | ORAL | 0 refills | Status: DC | PRN

## 2016-09-21 MED ORDER — ONDANSETRON HCL 4 MG/2ML IJ SOLN
4.0000 mg | Freq: Once | INTRAMUSCULAR | Status: AC
  Administered 2016-09-21: 4 mg via INTRAVENOUS

## 2016-09-21 MED ORDER — ONDANSETRON HCL 4 MG/2ML IJ SOLN: 4.0000 mg | INTRAMUSCULAR | Status: DC

## 2016-09-21 MED ORDER — MORPHINE SULFATE (PF) 4 MG/ML IV SOLN
4.0000 mg | Freq: Once | INTRAVENOUS | Status: AC
  Administered 2016-09-21: 4 mg via INTRAVENOUS

## 2016-09-21 NOTE — Discharge Instructions (Signed)

## 2016-09-21 NOTE — ED Notes (Signed)
Pt asleep, resps unlabored.  

## 2016-09-21 NOTE — ED Notes (Signed)
Additional warm blankets provided to pt and visitor.

## 2016-09-21 NOTE — ED Notes (Signed)
Pt triaged in error under Juanetta EDT. Pt was triaged by this RN in entirety.
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
## 2016-09-21 NOTE — ED Notes (Signed)
Pt complains of continued pain that is worse after returning from ct scan.

## 2016-09-21 NOTE — ED Triage Notes (Signed)
Pt states that he has been having abdominal pain under the umbilicus since last evening. Pt reports having one loose stool this AM. Pt states that he tried to wait and see if it went away but the pain became too much. Pt is ambulatory at this time.
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
## 2016-09-21 NOTE — ED Notes (Signed)
Pt changed into gown and assessed with great difficulty. Pt refusing to answer rn questions at first, girlfriend answering questions for pt.

## 2016-09-21 NOTE — ED Notes (Signed)
Pt has finished po contrast.  

## 2016-09-21 NOTE — ED Notes (Signed)
Pt vomiting contrast per ct tech. md notified by ct tech.

## 2017-07-16 ENCOUNTER — Encounter: Payer: Self-pay | Admitting: Emergency Medicine

## 2017-07-16 ENCOUNTER — Emergency Department: Payer: Medicaid - Out of State

## 2017-07-16 DIAGNOSIS — R0781 Pleurodynia: Secondary | ICD-10-CM | POA: Insufficient documentation

## 2017-07-16 DIAGNOSIS — F17210 Nicotine dependence, cigarettes, uncomplicated: Secondary | ICD-10-CM | POA: Insufficient documentation

## 2017-07-16 DIAGNOSIS — Z79899 Other long term (current) drug therapy: Secondary | ICD-10-CM | POA: Insufficient documentation

## 2017-07-16 DIAGNOSIS — I10 Essential (primary) hypertension: Secondary | ICD-10-CM | POA: Insufficient documentation

## 2017-07-16 DIAGNOSIS — R0789 Other chest pain: Secondary | ICD-10-CM | POA: Insufficient documentation

## 2017-07-16 DIAGNOSIS — J45909 Unspecified asthma, uncomplicated: Secondary | ICD-10-CM | POA: Insufficient documentation

## 2017-07-16 LAB — BASIC METABOLIC PANEL
Anion gap: 8 (ref 5–15)
BUN: 16 mg/dL (ref 6–20)
CHLORIDE: 103 mmol/L (ref 101–111)
CO2: 26 mmol/L (ref 22–32)
CREATININE: 0.75 mg/dL (ref 0.61–1.24)
Calcium: 9.5 mg/dL (ref 8.9–10.3)
GFR calc non Af Amer: >60 mL/min (ref >60)
Glucose, Bld: 79 mg/dL (ref 65–99)
Potassium: 3.7 mmol/L (ref 3.5–5.1)
SODIUM: 137 mmol/L (ref 135–145)

## 2017-07-16 LAB — CBC
HCT: 39.6 % — ABNORMAL LOW (ref 40.0–52.0)
Hemoglobin: 13.8 g/dL (ref 13.0–18.0)
MCH: 33.1 pg (ref 26.0–34.0)
MCHC: 34.7 g/dL (ref 32.0–36.0)
MCV: 95.5 fL (ref 80.0–100.0)
PLATELETS: 296 10*3/uL (ref 150–440)
RBC: 4.15 MIL/uL — ABNORMAL LOW (ref 4.40–5.90)
RDW: 13.1 % (ref 11.5–14.5)
WBC: 6.5 10*3/uL (ref 3.8–10.6)

## 2017-07-16 LAB — TROPONIN I: Troponin I: <0.03 ng/mL (ref <0.03)

## 2017-07-16 NOTE — ED Triage Notes (Signed)
Patient brought in by ems from home. Patient with complaint of left side chest pain that started about 18:30 tonight. Patient states that he has some shortness of breath and some nausea with the pain.

## 2017-07-17 ENCOUNTER — Emergency Department
Admission: EM | Admit: 2017-07-17 | Discharge: 2017-07-17 | Disposition: A | Discharge status: Home / Self Care | Payer: Medicaid - Out of State | Attending: Emergency Medicine | Admitting: Emergency Medicine

## 2017-07-17 DIAGNOSIS — R0781 Pleurodynia: Secondary | ICD-10-CM

## 2017-07-17 DIAGNOSIS — R0789 Other chest pain: Secondary | ICD-10-CM

## 2017-07-17 MED ORDER — HYDROCODONE-ACETAMINOPHEN 5-325 MG PO TABS: 1.0000 | ORAL_TABLET | Freq: Four times a day (QID) | ORAL | 0 refills | Status: DC | PRN

## 2017-07-17 MED ORDER — HYDROCODONE-ACETAMINOPHEN 5-325 MG PO TABS
1.0000 | ORAL_TABLET | Freq: Once | ORAL | Status: AC
  Administered 2017-07-17: 1 via ORAL
  Filled 2017-07-17: qty 1

## 2017-07-17 MED ORDER — KETOROLAC TROMETHAMINE 30 MG/ML IJ SOLN
10.0000 mg | Freq: Once | INTRAMUSCULAR | Status: AC
  Administered 2017-07-17: 9.9 mg via INTRAVENOUS
  Filled 2017-07-17: qty 1

## 2017-07-17 MED ORDER — IBUPROFEN 600 MG PO TABS: 600.0000 mg | ORAL_TABLET | Freq: Three times a day (TID) | ORAL | 0 refills | Status: DC | PRN

## 2017-07-17 NOTE — Discharge Instructions (Signed)
1.  You may take pain medicines as needed (Motrin/Norco #15). 2.  Apply moist heat to affected area several times daily. 3.  Return to the ER for worsening symptoms, persistent vomiting, difficulty breathing or other concerns. 

## 2017-07-17 NOTE — ED Notes (Signed)
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
## 2017-10-21 ENCOUNTER — Other Ambulatory Visit: Payer: Self-pay

## 2017-10-21 ENCOUNTER — Emergency Department
Admission: EM | Admit: 2017-10-21 | Discharge: 2017-10-21 | Disposition: A | Discharge status: Home / Self Care | Payer: Medicaid - Out of State | Attending: Emergency Medicine | Admitting: Emergency Medicine

## 2017-10-21 ENCOUNTER — Encounter: Payer: Self-pay | Admitting: Emergency Medicine

## 2017-10-21 DIAGNOSIS — J111 Influenza due to unidentified influenza virus with other respiratory manifestations: Secondary | ICD-10-CM | POA: Insufficient documentation

## 2017-10-21 DIAGNOSIS — Z79899 Other long term (current) drug therapy: Secondary | ICD-10-CM | POA: Insufficient documentation

## 2017-10-21 DIAGNOSIS — F17200 Nicotine dependence, unspecified, uncomplicated: Secondary | ICD-10-CM | POA: Insufficient documentation

## 2017-10-21 DIAGNOSIS — I10 Essential (primary) hypertension: Secondary | ICD-10-CM | POA: Insufficient documentation

## 2017-10-21 DIAGNOSIS — J45909 Unspecified asthma, uncomplicated: Secondary | ICD-10-CM | POA: Insufficient documentation

## 2017-10-21 LAB — INFLUENZA PANEL BY PCR (TYPE A & B)
INFLAPCR: POSITIVE — AB
INFLBPCR: NEGATIVE

## 2017-10-21 MED ORDER — ACETAMINOPHEN 325 MG PO TABS
650.0000 mg | ORAL_TABLET | Freq: Once | ORAL | Status: AC | PRN
  Administered 2017-10-21: 650 mg via ORAL
  Filled 2017-10-21: qty 2

## 2017-10-21 MED ORDER — ACETAMINOPHEN-CODEINE #3 300-30 MG PO TABS: 1.0000 | ORAL_TABLET | Freq: Four times a day (QID) | ORAL | 0 refills | Status: DC | PRN

## 2017-10-21 MED ORDER — FLUTICASONE PROPIONATE 50 MCG/ACT NA SUSP: 2.0000 | Freq: Every day | NASAL | 0 refills | Status: DC

## 2017-10-21 MED ORDER — BENZONATATE 100 MG PO CAPS: ORAL_CAPSULE | ORAL | 0 refills | Status: DC
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
## 2017-10-21 NOTE — Discharge Instructions (Signed)
Continue to monitor and treat fevers with ibuprofen. Drink fluids to prevent dehydration. Take OTC allergy medicine and cough medicine as needed. Return to the ED for worsening cough or shortness of breath.

## 2017-10-21 NOTE — ED Triage Notes (Signed)
Arrives with C/O fever, cough, body aches x 1 day.  Has not medicated for fever

## 2018-03-14 ENCOUNTER — Emergency Department: Payer: Medicaid - Out of State

## 2018-03-14 ENCOUNTER — Encounter: Payer: Self-pay | Admitting: Emergency Medicine

## 2018-03-14 ENCOUNTER — Emergency Department
Admission: EM | Admit: 2018-03-14 | Discharge: 2018-03-14 | Disposition: A | Discharge status: Home / Self Care | Payer: Medicaid - Out of State | Attending: Emergency Medicine | Admitting: Emergency Medicine

## 2018-03-14 ENCOUNTER — Other Ambulatory Visit: Payer: Self-pay

## 2018-03-14 DIAGNOSIS — F17200 Nicotine dependence, unspecified, uncomplicated: Secondary | ICD-10-CM | POA: Insufficient documentation

## 2018-03-14 DIAGNOSIS — Z79899 Other long term (current) drug therapy: Secondary | ICD-10-CM | POA: Insufficient documentation

## 2018-03-14 DIAGNOSIS — R079 Chest pain, unspecified: Secondary | ICD-10-CM | POA: Insufficient documentation

## 2018-03-14 DIAGNOSIS — I10 Essential (primary) hypertension: Secondary | ICD-10-CM | POA: Insufficient documentation

## 2018-03-14 DIAGNOSIS — J45909 Unspecified asthma, uncomplicated: Secondary | ICD-10-CM | POA: Insufficient documentation

## 2018-03-14 LAB — BASIC METABOLIC PANEL
ANION GAP: 8 (ref 5–15)
BUN: 19 mg/dL (ref 6–20)
CO2: 23 mmol/L (ref 22–32)
Calcium: 9.5 mg/dL (ref 8.9–10.3)
Chloride: 102 mmol/L (ref 101–111)
Creatinine, Ser: 0.58 mg/dL — ABNORMAL LOW (ref 0.61–1.24)
GFR calc Af Amer: >60 mL/min (ref >60)
GLUCOSE: 94 mg/dL (ref 65–99)
POTASSIUM: 3.9 mmol/L (ref 3.5–5.1)
Sodium: 133 mmol/L — ABNORMAL LOW (ref 135–145)

## 2018-03-14 LAB — CBC
HEMATOCRIT: 40.7 % (ref 40.0–52.0)
HEMOGLOBIN: 13.9 g/dL (ref 13.0–18.0)
MCH: 33.6 pg (ref 26.0–34.0)
MCHC: 34.1 g/dL (ref 32.0–36.0)
MCV: 98.4 fL (ref 80.0–100.0)
Platelets: 298 10*3/uL (ref 150–440)
RBC: 4.14 MIL/uL — ABNORMAL LOW (ref 4.40–5.90)
RDW: 12.5 % (ref 11.5–14.5)
WBC: 7.8 10*3/uL (ref 3.8–10.6)

## 2018-03-14 LAB — TROPONIN I: Troponin I: <0.03 ng/mL (ref <0.03)

## 2018-03-14 MED ORDER — KETOROLAC TROMETHAMINE 60 MG/2ML IM SOLN
60.0000 mg | Freq: Once | INTRAMUSCULAR | Status: AC
  Administered 2018-03-14: 60 mg via INTRAMUSCULAR
  Filled 2018-03-14: qty 2
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
## 2018-03-14 NOTE — ED Notes (Signed)
First Nurse Note: Received phone call from [place] at [facility].  Pt is in the residential portion of the program and is not to receive narcotics unless absolutely necessary.

## 2018-03-14 NOTE — Discharge Instructions (Addendum)
Please follow-up with the acute care clinic for further evaluation of your chest pain. °

## 2018-03-14 NOTE — ED Notes (Signed)
Pt had to be awoken for toradol injection.

## 2018-03-14 NOTE — ED Notes (Signed)
Pt waiting on [facility] to pick him up. Pt remains in room 5.

## 2018-03-14 NOTE — ED Triage Notes (Signed)
Pt arrives via [facility] from [facility] with c/o chest pain x 2 days. Pt reports that when he was exercising the pain increased. Pt is in NAD.

## 2018-03-17 ENCOUNTER — Ambulatory Visit: Payer: Self-pay

## 2018-03-26 ENCOUNTER — Encounter: Payer: Self-pay | Admitting: Adult Health Nurse Practitioner

## 2018-03-26 ENCOUNTER — Ambulatory Visit: Payer: Self-pay | Admitting: Adult Health Nurse Practitioner

## 2018-03-26 DIAGNOSIS — Z0000 Encounter for general adult medical examination without abnormal findings: Secondary | ICD-10-CM

## 2018-03-26 NOTE — Progress Notes (Signed)
Patient: Shawn Arnold Male    DOB: August 10, 1986   32 y.o.   MRN: [id number] Visit Date: 03/26/2018  Today's Provider: Jacelyn Pi, NP   Chief Complaint  Patient presents with  . Medical Clearance    states he needs medical clearance after an episode of sharp pain due to a pulled muscle while lifting weights. Also notes a lump on the left chest for the past 1 year   Subjective:    HPI    Pt states that he was in the hospital 2 weeks ago for muscle strain- he was prescribed ibuprofen with moderate relief. Pt states that he has not any further episodes since that occurrence.  Pt was lifting weights at the time of the incidence.   Lump on left chest x 1 year. States that causes him pain intermittently- not related to any particular activity.     Maternal Grandma with cancer and diabetes.       No Known Allergies Previous Medications   ACETAMINOPHEN-CODEINE (TYLENOL #3) 300-30 MG TABLET    Take 1 tablet by mouth every 6 (six) hours as needed for moderate pain.   BENZONATATE (TESSALON PERLES) 100 MG CAPSULE    Take 1-2 tabs TID prn cough   DOCUSATE SODIUM (COLACE) 100 MG CAPSULE    Take 1 tablet once or twice daily as needed for constipation while taking narcotic pain medicine   FLUTICASONE (FLONASE) 50 MCG/ACT NASAL SPRAY    Place 2 sprays into both nostrils daily.   HYDROCODONE-ACETAMINOPHEN (NORCO) 5-325 MG TABLET    Take 1 tablet by mouth every 6 (six) hours as needed for moderate pain.   IBUPROFEN (ADVIL,MOTRIN) 600 MG TABLET    Take 1 tablet (600 mg total) by mouth every 8 (eight) hours as needed.   METHOCARBAMOL (ROBAXIN) 500 MG TABLET    Take 1 tablet (500 mg total) by mouth 2 (two) times daily.   ONDANSETRON (ZOFRAN ODT) 4 MG DISINTEGRATING TABLET    Allow 1-2 tablets to dissolve in your mouth every 8 hours as needed for nausea/vomiting   PENICILLIN V POTASSIUM (VEETID) 500 MG TABLET    Take 1 tablet (500 mg total) by mouth 4 (four) times daily.    Review of Systems   All other systems reviewed and are negative.   Social History   Tobacco Use  . Smoking status: Current Every Day Smoker    Packs/day: 1.00  . Smokeless tobacco: Never Used  Substance Use Topics  . Alcohol use: Yes    Comment: ocassional   Objective:   BP 136/80 (BP Location: Right Arm, Patient Position: Sitting, Cuff Size: Normal)   Pulse 83   Temp 98.4 F (36.9 C)   Ht 6' 3.25" (1.911 m)   Wt 198 lb 6.6 oz (90 kg)   BMI 24.64 kg/m   Physical Exam  Constitutional: He is oriented to person, place, and time. He appears well-developed and well-nourished.  HENT:  Head: Normocephalic and atraumatic.  Eyes: Pupils are equal, round, and reactive to light.  Neck: Normal range of motion. Neck supple.  Cardiovascular: Normal rate, regular rhythm, normal heart sounds and intact distal pulses.  Pulmonary/Chest: Effort normal and breath sounds normal.  Abdominal: Soft. Bowel sounds are normal.  Musculoskeletal: Normal range of motion.  Neurological: [name] and oriented to person, place, and time.  Skin: Skin is warm and dry.        Assessment & Plan:     Routine labs ordered.  FU in 1  year of PRN.  Encouraged smoking cessation. May exercise lift weights as tolerated.         Jacelyn Pi, NP   [facility]

## 2018-03-27 LAB — HEPATIC FUNCTION PANEL
ALK PHOS: 65 IU/L (ref 39–117)
ALT: 9 IU/L (ref 0–44)
AST: 13 IU/L (ref 0–40)
Albumin: 4.4 g/dL (ref 3.5–5.5)
BILIRUBIN, DIRECT: 0.07 mg/dL (ref 0.00–0.40)
Bilirubin Total: <0.2 mg/dL (ref 0.0–1.2)
TOTAL PROTEIN: 7 g/dL (ref 6.0–8.5)

## 2018-03-27 LAB — LIPID PANEL
Chol/HDL Ratio: 5.2 ratio — ABNORMAL HIGH (ref 0.0–5.0)
Cholesterol, Total: 157 mg/dL (ref 100–199)
HDL: 30 mg/dL — AB (ref >39)
LDL Calculated: 88 mg/dL (ref 0–99)
Triglycerides: 196 mg/dL — ABNORMAL HIGH (ref 0–149)
VLDL Cholesterol Cal: 39 mg/dL (ref 5–40)

## 2018-03-27 LAB — HEMOGLOBIN A1C
Est. average glucose Bld gHb Est-mCnc: 91 mg/dL
HEMOGLOBIN A1C: 4.8 % (ref 4.8–5.6)

## 2018-03-27 LAB — TSH: TSH: 0.761 u[IU]/mL (ref 0.450–4.500)

## 2018-04-02 ENCOUNTER — Telehealth: Payer: Self-pay

## 2018-04-02 ENCOUNTER — Telehealth: Payer: Self-pay | Admitting: Licensed Clinical Social Worker

## 2018-04-02 NOTE — Telephone Encounter (Signed)
Clinician attempted to contact the patient to see if he would interested in providing consent to be apart of the web based case management program [facility] [employer] for assistance with resources.  A male answered the phone stating that this is not Shawn Arnold's phone to no longer call her.

## 2018-04-02 NOTE — Telephone Encounter (Signed)
Called pt to give lab results. Phone number in chart does not belong to pt.

## 2018-04-02 NOTE — Telephone Encounter (Signed)
-----   Message from Jacelyn Pieah Doles-Johnson, NP sent at 03/31/2018  5:39 PM EDT ----- Labs stable.

## 2019-01-13 ENCOUNTER — Encounter: Payer: Self-pay | Admitting: Emergency Medicine

## 2019-01-13 ENCOUNTER — Other Ambulatory Visit: Payer: Self-pay

## 2019-01-13 ENCOUNTER — Emergency Department
Admission: EM | Admit: 2019-01-13 | Discharge: 2019-01-13 | Disposition: A | Discharge status: Home / Self Care | Payer: Medicaid - Out of State | Attending: Emergency Medicine | Admitting: Emergency Medicine

## 2019-01-13 DIAGNOSIS — F17200 Nicotine dependence, unspecified, uncomplicated: Secondary | ICD-10-CM | POA: Insufficient documentation

## 2019-01-13 DIAGNOSIS — I10 Essential (primary) hypertension: Secondary | ICD-10-CM | POA: Insufficient documentation

## 2019-01-13 DIAGNOSIS — E876 Hypokalemia: Secondary | ICD-10-CM | POA: Insufficient documentation

## 2019-01-13 DIAGNOSIS — K529 Noninfective gastroenteritis and colitis, unspecified: Secondary | ICD-10-CM | POA: Insufficient documentation

## 2019-01-13 DIAGNOSIS — J45909 Unspecified asthma, uncomplicated: Secondary | ICD-10-CM | POA: Insufficient documentation

## 2019-01-13 LAB — COMPREHENSIVE METABOLIC PANEL
ALT: 10 U/L (ref 0–44)
AST: 16 U/L (ref 15–41)
Albumin: 3 g/dL — ABNORMAL LOW (ref 3.5–5.0)
Alkaline Phosphatase: 41 U/L (ref 38–126)
Anion gap: 8 (ref 5–15)
BUN: <5 mg/dL — ABNORMAL LOW (ref 6–20)
CO2: 27 mmol/L (ref 22–32)
CREATININE: 0.64 mg/dL (ref 0.61–1.24)
Calcium: 8.1 mg/dL — ABNORMAL LOW (ref 8.9–10.3)
Chloride: 105 mmol/L (ref 98–111)
GFR calc Af Amer: >60 mL/min (ref >60)
GFR calc non Af Amer: >60 mL/min (ref >60)
Glucose, Bld: 72 mg/dL (ref 70–99)
Potassium: 3 mmol/L — ABNORMAL LOW (ref 3.5–5.1)
Sodium: 140 mmol/L (ref 135–145)
Total Bilirubin: 0.6 mg/dL (ref 0.3–1.2)
Total Protein: 5.7 g/dL — ABNORMAL LOW (ref 6.5–8.1)

## 2019-01-13 LAB — CBC WITH DIFFERENTIAL/PLATELET
ABS IMMATURE GRANULOCYTES: 0.01 10*3/uL (ref 0.00–0.07)
Basophils Absolute: 0 10*3/uL (ref 0.0–0.1)
Basophils Relative: 1 %
Eosinophils Absolute: 0.4 10*3/uL (ref 0.0–0.5)
Eosinophils Relative: 8 %
HCT: 45.2 % (ref 39.0–52.0)
Hemoglobin: 14.6 g/dL (ref 13.0–17.0)
Immature Granulocytes: 0 %
Lymphocytes Relative: 44 %
Lymphs Abs: 2.3 10*3/uL (ref 0.7–4.0)
MCH: 32.4 pg (ref 26.0–34.0)
MCHC: 32.3 g/dL (ref 30.0–36.0)
MCV: 100.4 fL — ABNORMAL HIGH (ref 80.0–100.0)
Monocytes Absolute: 0.4 10*3/uL (ref 0.1–1.0)
Monocytes Relative: 7 %
Neutro Abs: 2.1 10*3/uL (ref 1.7–7.7)
Neutrophils Relative %: 40 %
PLATELETS: 241 10*3/uL (ref 150–400)
RBC: 4.5 MIL/uL (ref 4.22–5.81)
RDW: 12.3 % (ref 11.5–15.5)
WBC: 5.3 10*3/uL (ref 4.0–10.5)
nRBC: 0 % (ref 0.0–0.2)

## 2019-01-13 LAB — LIPASE, BLOOD: Lipase: 28 U/L (ref 11–51)

## 2019-01-13 MED ORDER — SODIUM CHLORIDE 0.9 % IV BOLUS
1000.0000 mL | Freq: Once | INTRAVENOUS | Status: AC
  Administered 2019-01-13: 1000 mL via INTRAVENOUS

## 2019-01-13 MED ORDER — KETOROLAC TROMETHAMINE 30 MG/ML IJ SOLN
15.0000 mg | Freq: Once | INTRAMUSCULAR | Status: AC
  Administered 2019-01-13: 15 mg via INTRAVENOUS
  Filled 2019-01-13: qty 1

## 2019-01-13 MED ORDER — ONDANSETRON 4 MG PO TBDP: 4.0000 mg | ORAL_TABLET | Freq: Three times a day (TID) | ORAL | 0 refills | Status: DC | PRN

## 2019-01-13 MED ORDER — POTASSIUM CHLORIDE CRYS ER 20 MEQ PO TBCR
40.0000 meq | EXTENDED_RELEASE_TABLET | Freq: Once | ORAL | Status: AC
  Administered 2019-01-13: 40 meq via ORAL
  Filled 2019-01-13: qty 2

## 2019-01-13 MED ORDER — ONDANSETRON HCL 4 MG/2ML IJ SOLN
4.0000 mg | Freq: Once | INTRAMUSCULAR | Status: AC
  Administered 2019-01-13: 4 mg via INTRAVENOUS
  Filled 2019-01-13: qty 2
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
## 2019-01-13 NOTE — ED Triage Notes (Signed)
PT arrives with complaints of diarrhea and headache that started this morning. Pt reports 6 episodes of diarrhea today.

## 2019-01-28 ENCOUNTER — Emergency Department
Admission: EM | Admit: 2019-01-28 | Discharge: 2019-01-28 | Disposition: A | Discharge status: Home / Self Care | Payer: Medicaid - Out of State | Attending: Emergency Medicine | Admitting: Emergency Medicine

## 2019-01-28 ENCOUNTER — Other Ambulatory Visit: Payer: Self-pay

## 2019-01-28 ENCOUNTER — Emergency Department: Payer: Medicaid - Out of State

## 2019-01-28 DIAGNOSIS — F17210 Nicotine dependence, cigarettes, uncomplicated: Secondary | ICD-10-CM | POA: Insufficient documentation

## 2019-01-28 DIAGNOSIS — J45909 Unspecified asthma, uncomplicated: Secondary | ICD-10-CM | POA: Insufficient documentation

## 2019-01-28 DIAGNOSIS — I10 Essential (primary) hypertension: Secondary | ICD-10-CM | POA: Insufficient documentation

## 2019-01-28 DIAGNOSIS — R0789 Other chest pain: Secondary | ICD-10-CM

## 2019-01-28 MED ORDER — IBUPROFEN 600 MG PO TABS
600.0000 mg | ORAL_TABLET | Freq: Once | ORAL | Status: AC
  Administered 2019-01-28: 600 mg via ORAL
  Filled 2019-01-28: qty 1

## 2019-01-28 MED ORDER — IBUPROFEN 600 MG PO TABS: 600.0000 mg | ORAL_TABLET | Freq: Three times a day (TID) | ORAL | 0 refills | Status: DC | PRN

## 2019-01-28 NOTE — Discharge Instructions (Signed)
Call 1 of the clinics listed on your discharge papers and get established with a primary care provider.  Begin taking ibuprofen 600 mg 3 times daily with food.  You may return tomorrow to work.
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
## 2019-01-28 NOTE — ED Triage Notes (Signed)
Pt arrives via POV from work. 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 Denies any heavy lifting or injury Pt states hurts to palpate area on left side. Denies and SOB at this time, deep breathing increases pain in side. NAD noted at this time

## 2019-03-30 ENCOUNTER — Ambulatory Visit: Payer: Self-pay

## 2020-09-18 ENCOUNTER — Emergency Department
Admission: EM | Admit: 2020-09-18 | Discharge: 2020-09-18 | Disposition: A | Discharge status: Home / Self Care | Payer: Self-pay | Attending: Emergency Medicine | Admitting: Emergency Medicine

## 2020-09-18 ENCOUNTER — Encounter: Payer: Self-pay | Admitting: *Deleted

## 2020-09-18 ENCOUNTER — Emergency Department: Payer: Self-pay

## 2020-09-18 ENCOUNTER — Other Ambulatory Visit: Payer: Self-pay

## 2020-09-18 DIAGNOSIS — Z20822 Contact with and (suspected) exposure to covid-19: Secondary | ICD-10-CM | POA: Insufficient documentation

## 2020-09-18 DIAGNOSIS — I889 Nonspecific lymphadenitis, unspecified: Secondary | ICD-10-CM

## 2020-09-18 DIAGNOSIS — M5481 Occipital neuralgia: Secondary | ICD-10-CM | POA: Insufficient documentation

## 2020-09-18 DIAGNOSIS — F17200 Nicotine dependence, unspecified, uncomplicated: Secondary | ICD-10-CM | POA: Insufficient documentation

## 2020-09-18 DIAGNOSIS — I10 Essential (primary) hypertension: Secondary | ICD-10-CM | POA: Insufficient documentation

## 2020-09-18 DIAGNOSIS — J45909 Unspecified asthma, uncomplicated: Secondary | ICD-10-CM | POA: Insufficient documentation

## 2020-09-18 DIAGNOSIS — L049 Acute lymphadenitis, unspecified: Secondary | ICD-10-CM | POA: Insufficient documentation

## 2020-09-18 LAB — CBC WITH DIFFERENTIAL/PLATELET
Abs Immature Granulocytes: 0.03 10*3/uL (ref 0.00–0.07)
Basophils Absolute: 0.1 10*3/uL (ref 0.0–0.1)
Basophils Relative: 1 %
Eosinophils Absolute: 0.7 10*3/uL — ABNORMAL HIGH (ref 0.0–0.5)
Eosinophils Relative: 9 %
HCT: 40.8 % (ref 39.0–52.0)
Hemoglobin: 13.2 g/dL (ref 13.0–17.0)
Immature Granulocytes: 0 %
Lymphocytes Relative: 37 %
Lymphs Abs: 2.9 10*3/uL (ref 0.7–4.0)
MCH: 32.9 pg (ref 26.0–34.0)
MCHC: 32.4 g/dL (ref 30.0–36.0)
MCV: 101.7 fL — ABNORMAL HIGH (ref 80.0–100.0)
Monocytes Absolute: 0.3 10*3/uL (ref 0.1–1.0)
Monocytes Relative: 4 %
Neutro Abs: 3.7 10*3/uL (ref 1.7–7.7)
Neutrophils Relative %: 49 %
Platelets: 300 10*3/uL (ref 150–400)
RBC: 4.01 MIL/uL — ABNORMAL LOW (ref 4.22–5.81)
RDW: 11.7 % (ref 11.5–15.5)
WBC: 7.7 10*3/uL (ref 4.0–10.5)
nRBC: 0 % (ref 0.0–0.2)

## 2020-09-18 LAB — COMPREHENSIVE METABOLIC PANEL
ALT: 48 U/L — ABNORMAL HIGH (ref 0–44)
AST: 25 U/L (ref 15–41)
Albumin: 4.2 g/dL (ref 3.5–5.0)
Alkaline Phosphatase: 45 U/L (ref 38–126)
Anion gap: 8 (ref 5–15)
BUN: 9 mg/dL (ref 6–20)
CO2: 27 mmol/L (ref 22–32)
Calcium: 9.3 mg/dL (ref 8.9–10.3)
Chloride: 101 mmol/L (ref 98–111)
Creatinine, Ser: 0.87 mg/dL (ref 0.61–1.24)
GFR, Estimated: >60 mL/min (ref >60)
Glucose, Bld: 93 mg/dL (ref 70–99)
Potassium: 4 mmol/L (ref 3.5–5.1)
Sodium: 136 mmol/L (ref 135–145)
Total Bilirubin: 0.9 mg/dL (ref 0.3–1.2)
Total Protein: 7.5 g/dL (ref 6.5–8.1)

## 2020-09-18 LAB — RESP PANEL BY RT-PCR (FLU A&B, COVID) ARPGX2
Influenza A by PCR: NEGATIVE
Influenza B by PCR: NEGATIVE
SARS Coronavirus 2 by RT PCR: NEGATIVE

## 2020-09-18 LAB — GROUP A STREP BY PCR: Group A Strep by PCR: NOT DETECTED

## 2020-09-18 LAB — SEDIMENTATION RATE: Sed Rate: 3 mm/hr (ref 0–15)

## 2020-09-18 MED ORDER — FENTANYL CITRATE (PF) 100 MCG/2ML IJ SOLN
50.0000 ug | Freq: Once | INTRAMUSCULAR | Status: AC
  Administered 2020-09-18: 50 ug via INTRAVENOUS
  Filled 2020-09-18: qty 2

## 2020-09-18 MED ORDER — CYCLOBENZAPRINE HCL 10 MG PO TABS: 10.0000 mg | ORAL_TABLET | Freq: Three times a day (TID) | ORAL | 0 refills | Status: DC | PRN

## 2020-09-18 MED ORDER — SODIUM CHLORIDE 0.9 % IV BOLUS
1000.0000 mL | Freq: Once | INTRAVENOUS | Status: AC
  Administered 2020-09-18: 1000 mL via INTRAVENOUS

## 2020-09-18 MED ORDER — MORPHINE SULFATE (PF) 4 MG/ML IV SOLN
4.0000 mg | Freq: Once | INTRAVENOUS | Status: AC
  Administered 2020-09-18: 4 mg via INTRAVENOUS
  Filled 2020-09-18: qty 1

## 2020-09-18 MED ORDER — CEPHALEXIN 500 MG PO CAPS: 500.0000 mg | ORAL_CAPSULE | Freq: Three times a day (TID) | ORAL | 0 refills | Status: DC

## 2020-09-18 MED ORDER — KETOROLAC TROMETHAMINE 30 MG/ML IJ SOLN
30.0000 mg | Freq: Once | INTRAMUSCULAR | Status: AC
  Administered 2020-09-18: 30 mg via INTRAVENOUS
  Filled 2020-09-18: qty 1

## 2020-09-18 MED ORDER — PREDNISONE 10 MG (21) PO TBPK: ORAL_TABLET | ORAL | 0 refills | Status: DC

## 2020-09-18 MED ORDER — ONDANSETRON HCL 4 MG/2ML IJ SOLN
4.0000 mg | Freq: Once | INTRAMUSCULAR | Status: AC
  Administered 2020-09-18: 4 mg via INTRAVENOUS
  Filled 2020-09-18: qty 2

## 2020-09-18 NOTE — ED Triage Notes (Signed)
Pt ambulatory to triage.  Pt has a headache since yesterday.  Pt taking motrin without relief. No n/v  Pt alert  Speech clear.
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
## 2020-09-18 NOTE — Discharge Instructions (Signed)
Follow-up with your regular doctor if not improving in 2 to 3 days.  Return emergency department worsening.  Use the medications as prescribed.  You may also take Tylenol for pain as needed.

## 2020-09-19 LAB — HIV ANTIBODY (ROUTINE TESTING W REFLEX): HIV Screen 4th Generation wRfx: NONREACTIVE

## 2021-10-24 ENCOUNTER — Other Ambulatory Visit: Payer: Self-pay

## 2021-10-24 ENCOUNTER — Ambulatory Visit: Payer: Medicaid Other | Admitting: Pharmacy Technician

## 2021-10-24 DIAGNOSIS — Z79899 Other long term (current) drug therapy: Secondary | ICD-10-CM

## 2021-10-24 NOTE — Progress Notes (Signed)
Completed [facility] application.  Patient agreed to all terms of the [facility] contract.  Pharmacologist, DOH Attestation and Patient Advocate Form to patient to sign and return to [facility].    Patient approved to receive medication assistance at [facility] until time for re-certification in 1820, and as long as eligibility criteria continues to be met..    Provided patient with community resource material based on his particular needs.    [facility]

## 2021-11-07 ENCOUNTER — Other Ambulatory Visit: Payer: Self-pay

## 2021-11-07 ENCOUNTER — Encounter: Payer: Self-pay | Admitting: Internal Medicine

## 2021-11-07 ENCOUNTER — Ambulatory Visit: Payer: Self-pay | Admitting: Internal Medicine

## 2021-11-07 VITALS — BP 126/82 | HR 89 | Temp 98.6°F | Resp 16 | Ht 76.0 in | Wt 216.5 lb

## 2021-11-07 DIAGNOSIS — R222 Localized swelling, mass and lump, trunk: Secondary | ICD-10-CM | POA: Insufficient documentation

## 2021-11-07 DIAGNOSIS — H9201 Otalgia, right ear: Secondary | ICD-10-CM | POA: Insufficient documentation

## 2021-11-07 DIAGNOSIS — Z0000 Encounter for general adult medical examination without abnormal findings: Secondary | ICD-10-CM
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
## 2021-11-08 LAB — CBC WITH DIFFERENTIAL/PLATELET
Basophils Absolute: 0.1 10*3/uL (ref 0.0–0.2)
Basos: 1 %
EOS (ABSOLUTE): 0.6 10*3/uL — ABNORMAL HIGH (ref 0.0–0.4)
Eos: 7 %
Hematocrit: 42 % (ref 37.5–51.0)
Hemoglobin: 14 g/dL (ref 13.0–17.7)
Immature Grans (Abs): 0 10*3/uL (ref 0.0–0.1)
Immature Granulocytes: 0 %
Lymphocytes Absolute: 3.2 10*3/uL — ABNORMAL HIGH (ref 0.7–3.1)
Lymphs: 38 %
MCH: 31.4 pg (ref 26.6–33.0)
MCHC: 33.3 g/dL (ref 31.5–35.7)
MCV: 94 fL (ref 79–97)
Monocytes Absolute: 0.5 10*3/uL (ref 0.1–0.9)
Monocytes: 6 %
Neutrophils Absolute: 4 10*3/uL (ref 1.4–7.0)
Neutrophils: 48 %
Platelets: 349 10*3/uL (ref 150–450)
RBC: 4.46 x10E6/uL (ref 4.14–5.80)
RDW: 10.9 % — ABNORMAL LOW (ref 11.6–15.4)
WBC: 8.3 10*3/uL (ref 3.4–10.8)

## 2021-11-08 LAB — COMPREHENSIVE METABOLIC PANEL
ALT: 13 IU/L (ref 0–44)
AST: 18 IU/L (ref 0–40)
Albumin/Globulin Ratio: 1.6 (ref 1.2–2.2)
Albumin: 4.7 g/dL (ref 4.0–5.0)
Alkaline Phosphatase: 60 IU/L (ref 44–121)
BUN/Creatinine Ratio: 13 (ref 9–20)
BUN: 11 mg/dL (ref 6–20)
Bilirubin Total: 0.4 mg/dL (ref 0.0–1.2)
CO2: 24 mmol/L (ref 20–29)
Calcium: 9.9 mg/dL (ref 8.7–10.2)
Chloride: 101 mmol/L (ref 96–106)
Creatinine, Ser: 0.82 mg/dL (ref 0.76–1.27)
Globulin, Total: 3 g/dL (ref 1.5–4.5)
Glucose: 103 mg/dL — ABNORMAL HIGH (ref 70–99)
Potassium: 4.4 mmol/L (ref 3.5–5.2)
Sodium: 139 mmol/L (ref 134–144)
Total Protein: 7.7 g/dL (ref 6.0–8.5)
eGFR: 117 mL/min/{1.73_m2} (ref >59)

## 2021-11-08 LAB — LIPID PANEL
Chol/HDL Ratio: 5.5 ratio — ABNORMAL HIGH (ref 0.0–5.0)
Cholesterol, Total: 213 mg/dL — ABNORMAL HIGH (ref 100–199)
HDL: 39 mg/dL — ABNORMAL LOW (ref >39)
LDL Chol Calc (NIH): 136 mg/dL — ABNORMAL HIGH (ref 0–99)
Triglycerides: 212 mg/dL — ABNORMAL HIGH (ref 0–149)
VLDL Cholesterol Cal: 38 mg/dL (ref 5–40)

## 2021-11-08 LAB — URINALYSIS, ROUTINE W REFLEX MICROSCOPIC
Bilirubin, UA: NEGATIVE
Glucose, UA: NEGATIVE
Ketones, UA: NEGATIVE
Leukocytes,UA: NEGATIVE
Nitrite, UA: NEGATIVE
Protein,UA: NEGATIVE
RBC, UA: NEGATIVE
Specific Gravity, UA: 1.027 (ref 1.005–1.030)
Urobilinogen, Ur: 1 mg/dL (ref 0.2–1.0)
pH, UA: 7.5 (ref 5.0–7.5)

## 2021-11-08 LAB — TSH: TSH: 1.07 u[IU]/mL (ref 0.450–4.500)

## 2021-11-14 ENCOUNTER — Ambulatory Visit: Payer: Medicaid Other | Admitting: Internal Medicine

## 2021-11-14 ENCOUNTER — Telehealth: Payer: Self-pay | Admitting: Emergency Medicine

## 2021-11-14 NOTE — Telephone Encounter (Signed)
Patient called requesting lab results. He was not able to keep appointment for today.  Advised patient labs WNL per Dr. Candelaria Stagers. Patient states his ear pain has resolved. Advised to schedule appointment if his ear starts to bother him again. Patient agreed and voiced understanding.

## 2021-12-17 ENCOUNTER — Other Ambulatory Visit: Payer: Self-pay

## 2022-02-28 ENCOUNTER — Other Ambulatory Visit: Payer: Self-pay

## 2022-04-20 ENCOUNTER — Emergency Department: Payer: Medicaid Other

## 2022-04-20 ENCOUNTER — Other Ambulatory Visit: Payer: Self-pay

## 2022-04-20 ENCOUNTER — Encounter: Payer: Self-pay | Admitting: Emergency Medicine

## 2022-04-20 ENCOUNTER — Emergency Department
Admission: EM | Admit: 2022-04-20 | Discharge: 2022-04-20 | Disposition: A | Discharge status: Home / Self Care | Payer: Medicaid Other | Attending: Emergency Medicine | Admitting: Emergency Medicine

## 2022-04-20 DIAGNOSIS — K047 Periapical abscess without sinus: Secondary | ICD-10-CM | POA: Insufficient documentation

## 2022-04-20 DIAGNOSIS — R6884 Jaw pain: Secondary | ICD-10-CM

## 2022-04-20 MED ORDER — KETOROLAC TROMETHAMINE 60 MG/2ML IM SOLN
60.0000 mg | Freq: Once | INTRAMUSCULAR | Status: AC
  Administered 2022-04-20: 60 mg via INTRAMUSCULAR
  Filled 2022-04-20: qty 2

## 2022-04-20 MED ORDER — AMOXICILLIN-POT CLAVULANATE 875-125 MG PO TABS
1.0000 | ORAL_TABLET | Freq: Once | ORAL | Status: AC
  Administered 2022-04-20: 1 via ORAL
  Filled 2022-04-20: qty 1

## 2022-04-20 MED ORDER — IBUPROFEN 600 MG PO TABS
600.0000 mg | ORAL_TABLET | Freq: Three times a day (TID) | ORAL | 0 refills | Status: DC | PRN
  Filled 2022-04-20: qty 20, 7d supply, fill #0

## 2022-04-20 MED ORDER — AMOXICILLIN-POT CLAVULANATE 875-125 MG PO TABS
1.0000 | ORAL_TABLET | Freq: Two times a day (BID) | ORAL | 0 refills | Status: DC
  Filled 2022-04-20: qty 20, 10d supply, fill #0

## 2022-04-20 MED ORDER — IBUPROFEN 600 MG PO TABS: 600.0000 mg | ORAL_TABLET | Freq: Three times a day (TID) | ORAL | 0 refills | Status: DC | PRN

## 2022-04-20 MED ORDER — AMOXICILLIN-POT CLAVULANATE 875-125 MG PO TABS: 1.0000 | ORAL_TABLET | Freq: Two times a day (BID) | ORAL | 0 refills | Status: AC

## 2022-04-20 NOTE — ED Notes (Signed)
Pt has drank of gingerale with no nausea
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
## 2022-04-21 ENCOUNTER — Other Ambulatory Visit: Payer: Self-pay

## 2022-08-21 ENCOUNTER — Other Ambulatory Visit: Payer: Self-pay

## 2022-08-21 ENCOUNTER — Encounter: Payer: Self-pay | Admitting: Gerontology

## 2022-08-21 ENCOUNTER — Ambulatory Visit: Payer: Medicaid Other | Admitting: Gerontology

## 2022-08-21 VITALS — BP 144/83 | HR 75 | Temp 98.7°F | Resp 16 | Ht 76.0 in | Wt 210.0 lb

## 2022-08-21 DIAGNOSIS — Z0000 Encounter for general adult medical examination without abnormal findings: Secondary | ICD-10-CM

## 2022-08-21 DIAGNOSIS — R82998 Other abnormal findings in urine: Secondary | ICD-10-CM | POA: Insufficient documentation

## 2022-08-21 DIAGNOSIS — R208 Other disturbances of skin sensation: Secondary | ICD-10-CM | POA: Insufficient documentation

## 2022-08-21 DIAGNOSIS — R030 Elevated blood-pressure reading, without diagnosis of hypertension: Secondary | ICD-10-CM | POA: Insufficient documentation
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
## 2022-08-21 NOTE — Patient Instructions (Signed)

## 2022-08-22 LAB — COMPREHENSIVE METABOLIC PANEL
ALT: 7 IU/L (ref 0–44)
AST: 14 IU/L (ref 0–40)
Albumin/Globulin Ratio: 1.9 (ref 1.2–2.2)
Albumin: 5 g/dL (ref 4.1–5.1)
Alkaline Phosphatase: 63 IU/L (ref 44–121)
BUN/Creatinine Ratio: 13 (ref 9–20)
BUN: 10 mg/dL (ref 6–20)
Bilirubin Total: 0.4 mg/dL (ref 0.0–1.2)
CO2: 21 mmol/L (ref 20–29)
Calcium: 10 mg/dL (ref 8.7–10.2)
Chloride: 103 mmol/L (ref 96–106)
Creatinine, Ser: 0.79 mg/dL (ref 0.76–1.27)
Globulin, Total: 2.6 g/dL (ref 1.5–4.5)
Glucose: 86 mg/dL (ref 70–99)
Potassium: 4.2 mmol/L (ref 3.5–5.2)
Sodium: 138 mmol/L (ref 134–144)
Total Protein: 7.6 g/dL (ref 6.0–8.5)
eGFR: 117 mL/min/{1.73_m2} (ref >59)

## 2022-08-22 LAB — UA/M W/RFLX CULTURE, ROUTINE
Bilirubin, UA: NEGATIVE
Glucose, UA: NEGATIVE
Ketones, UA: NEGATIVE
Leukocytes,UA: NEGATIVE
Nitrite, UA: NEGATIVE
Protein,UA: NEGATIVE
RBC, UA: NEGATIVE
Specific Gravity, UA: 1.006 (ref 1.005–1.030)
Urobilinogen, Ur: 0.2 mg/dL (ref 0.2–1.0)
pH, UA: 7.5 (ref 5.0–7.5)

## 2022-08-22 LAB — MICROSCOPIC EXAMINATION
Bacteria, UA: NONE SEEN
Casts: NONE SEEN /lpf
Epithelial Cells (non renal): NONE SEEN /hpf (ref 0–10)
RBC, Urine: NONE SEEN /hpf (ref 0–2)
WBC, UA: NONE SEEN /hpf (ref 0–5)

## 2022-08-22 LAB — LIPID PANEL
Chol/HDL Ratio: 5.1 ratio — ABNORMAL HIGH (ref 0.0–5.0)
Cholesterol, Total: 174 mg/dL (ref 100–199)
HDL: 34 mg/dL — ABNORMAL LOW (ref >39)
LDL Chol Calc (NIH): 117 mg/dL — ABNORMAL HIGH (ref 0–99)
Triglycerides: 124 mg/dL (ref 0–149)
VLDL Cholesterol Cal: 23 mg/dL (ref 5–40)

## 2022-09-03 ENCOUNTER — Telehealth: Payer: Self-pay | Admitting: Emergency Medicine

## 2022-09-03 NOTE — Telephone Encounter (Signed)
Advised patient labs were normal except for an elevated LDL, which will be discussed at next OV on 09/17/22. Patient agreed and verbalized understanding.

## 2022-09-03 NOTE — Telephone Encounter (Signed)
-----   Message from [facility] sent at 08/22/2022  3:19 PM EDT ----- Regarding: Question regarding results Patient called, Wants to know test results.

## 2022-09-17 ENCOUNTER — Encounter: Payer: Self-pay | Admitting: Gerontology

## 2022-09-17 ENCOUNTER — Ambulatory Visit: Payer: Medicaid Other | Admitting: Gerontology

## 2022-09-17 ENCOUNTER — Other Ambulatory Visit: Payer: Self-pay

## 2022-09-17 VITALS — BP 149/83 | HR 74 | Temp 97.8°F | Resp 16 | Ht 76.0 in | Wt 208.6 lb

## 2022-09-17 DIAGNOSIS — E785 Hyperlipidemia, unspecified: Secondary | ICD-10-CM

## 2022-09-17 DIAGNOSIS — R030 Elevated blood-pressure reading, without diagnosis of hypertension: Secondary | ICD-10-CM
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
## 2022-09-17 NOTE — Patient Instructions (Signed)

## 2022-10-08 ENCOUNTER — Ambulatory Visit: Payer: Medicaid Other | Admitting: Gerontology

## 2022-11-01 ENCOUNTER — Emergency Department
Admission: EM | Admit: 2022-11-01 | Discharge: 2022-11-01 | Disposition: A | Discharge status: Home / Self Care | Payer: 59 | Attending: Emergency Medicine | Admitting: Emergency Medicine

## 2022-11-01 ENCOUNTER — Other Ambulatory Visit: Payer: Self-pay

## 2022-11-01 DIAGNOSIS — M5416 Radiculopathy, lumbar region: Secondary | ICD-10-CM | POA: Insufficient documentation

## 2022-11-01 DIAGNOSIS — M5459 Other low back pain: Secondary | ICD-10-CM | POA: Diagnosis not present

## 2022-11-01 MED ORDER — NAPROXEN 500 MG PO TABS: 500.0000 mg | ORAL_TABLET | Freq: Two times a day (BID) | ORAL | 0 refills | Status: DC

## 2022-11-01 MED ORDER — PREDNISONE 10 MG (21) PO TBPK: ORAL_TABLET | ORAL | 0 refills | Status: DC

## 2022-11-01 MED ORDER — NAPROXEN 500 MG PO TABS: 500.0000 mg | ORAL_TABLET | Freq: Two times a day (BID) | ORAL | 0 refills | Status: AC

## 2022-11-01 MED ORDER — LIDOCAINE 5 % EX PTCH: 1.0000 | MEDICATED_PATCH | Freq: Two times a day (BID) | CUTANEOUS | 0 refills | Status: AC

## 2022-11-01 MED ORDER — LIDOCAINE 5 % EX PTCH
1.0000 | MEDICATED_PATCH | CUTANEOUS | Status: DC
  Administered 2022-11-01: 1 via TRANSDERMAL
  Filled 2022-11-01: qty 1

## 2022-11-01 MED ORDER — ACETAMINOPHEN 325 MG PO TABS
650.0000 mg | ORAL_TABLET | Freq: Once | ORAL | Status: AC
  Administered 2022-11-01: 650 mg via ORAL
  Filled 2022-11-01: qty 2

## 2022-11-01 MED ORDER — KETOROLAC TROMETHAMINE 15 MG/ML IJ SOLN
15.0000 mg | Freq: Once | INTRAMUSCULAR | Status: AC
  Administered 2022-11-01: 15 mg via INTRAMUSCULAR
  Filled 2022-11-01: qty 1

## 2022-11-01 MED ORDER — PREDNISONE 10 MG (21) PO TBPK
ORAL_TABLET | ORAL | 0 refills | Status: DC
Start: 2022-11-01 — End: 2024-08-30

## 2022-11-01 MED ORDER — LIDOCAINE HCL (PF) 1 % IJ SOLN
5.0000 mL | Freq: Once | INTRAMUSCULAR | Status: DC
  Filled 2022-11-01: qty 5

## 2022-11-01 NOTE — ED Triage Notes (Signed)
Pt to ED via POV from home. Pt reports he was at work and picked something up and felt something pop. Pt reports bilateral lower back pain, sacral pain and right leg pain.

## 2022-11-01 NOTE — Discharge Instructions (Signed)
Please take the medications as prescribed.  Please follow-up with occupational health and also Dr. Cari Caraway for further management.  Please return for any new, worsening, or change in symptoms or other concerns.

## 2022-11-01 NOTE — ED Provider Notes (Signed)
[facility] Provider Note    Event Date/Time   First MD Initiated Contact with Patient 11/01/22 1305     (approximate)   History   Back Pain   HPI  Shawn Arnold is a 37 y.o. male who presents today for evaluation of low back pain.  Patient reports that he bent over to lift something up it was very heavy at work, and felt a pop in his low back and pain down his right leg.  He has not had any urinary or fecal incontinence or retention.  He denies saddle anesthesia.  He is able to ambulate.  He has not had any weakness in his legs.  No fevers or chills.  Patient Active Problem List   Diagnosis Date Noted   Elevated lipids 09/17/2022   Elevated blood pressure reading 08/21/2022   Dark urine 08/21/2022   Sensation of foreign body in skin 08/21/2022   Subcutaneous nodule of abdominal wall 11/07/2021   Right ear pain 11/07/2021   Health care maintenance 03/26/2018          Physical Exam   Triage Vital Signs: ED Triage Vitals [11/01/22 1129]  Enc Vitals Group     BP (!) 152/95     Pulse Rate 86     Resp 18     Temp 98 F (36.7 C)     Temp Source Oral     SpO2 99 %     Weight      Height      Head Circumference      Peak Flow      Pain Score 8     Pain Loc      Pain Edu?      Excl. in [place]?     Most recent vital signs: Vitals:   11/01/22 1129  BP: (!) 152/95  Pulse: 86  Resp: 18  Temp: 98 F (36.7 C)  SpO2: 99%    Physical Exam Vitals and nursing note reviewed.  Constitutional:      General: Awake and alert. No acute distress.    Appearance: Normal appearance. The patient is normal weight.  HENT:     Head: Normocephalic and atraumatic.     Mouth: Mucous membranes are moist.  Eyes:     General: PERRL. Normal EOMs        Right eye: No discharge.        Left eye: No discharge.     Conjunctiva/sclera: Conjunctivae normal.  Cardiovascular:     Rate and Rhythm: Normal rate and regular rhythm.     Pulses: Normal pulses.   Pulmonary:     Effort: Pulmonary effort is normal. No respiratory distress.     Breath sounds: Normal breath sounds.  Abdominal:     Abdomen is soft. There is no abdominal tenderness. No rebound or guarding. No distention. Back: No midline tenderness.  Tenderness to right lower paraspinal muscles.  Strength and sensation 5/5 to bilateral lower extremities. Normal great toe extension against resistance. Normal sensation throughout feet. Normal patellar reflexes.  Positive SLR on the right and positive opposite SLR.  Musculoskeletal:        General: No swelling. Normal range of motion.     Cervical back: Normal range of motion and neck supple.  Skin:    General: Skin is warm and dry.     Capillary Refill: Capillary refill takes less than 2 seconds.     Findings: No rash.  Neurological:  Mental Status: The patient is awake and alert.      ED Results / Procedures / Treatments   Labs (all labs ordered are listed, but only abnormal results are displayed) Labs Reviewed - No data to display   EKG     RADIOLOGY     PROCEDURES:  Critical Care performed:   Procedures   MEDICATIONS ORDERED IN ED: Medications  lidocaine (LIDODERM) 5 % 1 patch (1 patch Transdermal Patch Applied 11/01/22 1349)  ketorolac (TORADOL) 15 MG/ML injection 15 mg (15 mg Intramuscular Given 11/01/22 1334)  acetaminophen (TYLENOL) tablet 650 mg (650 mg Oral Given 11/01/22 1334)     IMPRESSION / MDM / ASSESSMENT AND PLAN / ED COURSE  I reviewed the triage vital signs and the nursing notes.   Differential diagnosis includes, but is not limited to, lumbar radiculopathy, muscle spasm, muscle strain.  Patient is awake and alert, hemodynamically stable and neurovascularly intact.  He is nontoxic in appearance.    Patient was seen in the hallway given the busyness of the ER.  I asked if he was comfortable being seen in the hallway or if you would prefer until the room is opened and he reported that he was  comfortable being seen in the hallway did not wish to wait for an available room.  5 out of 5 strength with intact sensation to extensor hallucis dorsiflexion and plantarflexion of bilateral lower extremities with normal patellar reflexes bilaterally. Most likely etiology at this point is muscle strain vs herniated disc. No red flags to indicate patient is at risk for more auspicious process that would require urgent/emergent spinal imaging or subspecialty evaluation at this time. No major trauma, no midline tenderness, no history or physical exam findings to suggest cauda equina syndrome or spinal cord compression. No focal neurological deficits on exam. No constitutional symptoms or history of immunosuppression or IVDA to suggest potential for epidural abscess. Not anticoagulated, no history of bleeding diastasis to suggest risk for epidural hematoma. No chronic steroid use or advanced age or history of malignancy to suggest proclivity towards pathological fracture.  No abdominal pain or flank pain to suggest kidney stone, no history of kidney stone.  No fever or dysuria or CVAT to suggest pyelonephritis .  No chest pain, back pain, shortness of breath, neurological deficits, to suggest vascular catastrophe, and pulses are equal in all 4 extremities.  Discussed care instructions and return precautions with patient. Recommended close outpatient follow-up for re-evaluation.  He was given the information for both neurosurgery and occupational health and instructed to make appointments with both.  He was also given a work note, and then advised light duty without heavy lifting until cleared by occupational health and neurosurgery.  Patient agrees with plan of care. Will treat the patient symptomatically as needed for pain control. Will discharge patient to take these medications and return for any worsening or different pain or development of any neurologic symptoms. Educated patient regarding expected time course  for back pain to improve and recommended very close outpatient follow-up.  Patient understands and agrees with plan.  He was discharged in stable condition.  RN told me that previously printed naproxen and prednisone Dosepak cannot be found, therefore I reprinted.   Patient's presentation is most consistent with acute illness / injury with system symptoms.    FINAL CLINICAL IMPRESSION(S) / ED DIAGNOSES   Final diagnoses:  Lumbar radiculopathy     Rx / DC Orders   ED Discharge Orders  Ordered    predniSONE (STERAPRED UNI-PAK 21 TAB) 10 MG (21) TBPK tablet  Status:  Discontinued        11/01/22 1325    naproxen (NAPROSYN) 500 MG tablet  2 times daily with meals,   Status:  Discontinued        11/01/22 1325    lidocaine (LIDODERM) 5 %  Every 12 hours        11/01/22 1325    naproxen (NAPROSYN) 500 MG tablet  2 times daily with meals        11/01/22 1352    predniSONE (STERAPRED UNI-PAK 21 TAB) 10 MG (21) TBPK tablet        11/01/22 1352             Note:  This document was prepared using Dragon voice recognition software and may include unintentional dictation errors.   Emeline Gins 11/01/22 1504    Carrie Mew, MD 11/01/22 [phone]

## 2022-11-04 ENCOUNTER — Telehealth: Payer: Self-pay | Admitting: Neurosurgery

## 2022-11-04 NOTE — Telephone Encounter (Signed)
-----   Message from Peggyann Shoals sent at 11/04/2022 11:57 AM EST ----- Patient was contacted to set up a hospital follow-up appt for back pain. He said that at the moment he has not insurance and no money. He will call back to schedule appt.    Meade Maw, MD   Stacy or danielle would be fine, or me if they are booked out more than 3 weeks      ----- Message ----- From: Carrie Mew, MD Sent: 11/01/2022   5:53 PM EST To: Meade Maw, MD

## 2023-04-10 ENCOUNTER — Emergency Department (HOSPITAL_COMMUNITY): Payer: 59

## 2023-04-10 ENCOUNTER — Emergency Department (HOSPITAL_COMMUNITY)
Admission: EM | Admit: 2023-04-10 | Discharge: 2023-04-10 | Disposition: A | Discharge status: Home / Self Care | Payer: 59 | Attending: Emergency Medicine | Admitting: Emergency Medicine

## 2023-04-10 ENCOUNTER — Other Ambulatory Visit: Payer: Self-pay

## 2023-04-10 ENCOUNTER — Encounter (HOSPITAL_COMMUNITY): Payer: Self-pay | Admitting: *Deleted

## 2023-04-10 DIAGNOSIS — Z1152 Encounter for screening for COVID-19: Secondary | ICD-10-CM | POA: Insufficient documentation

## 2023-04-10 DIAGNOSIS — R509 Fever, unspecified: Secondary | ICD-10-CM | POA: Diagnosis not present

## 2023-04-10 DIAGNOSIS — J020 Streptococcal pharyngitis: Secondary | ICD-10-CM | POA: Diagnosis not present

## 2023-04-10 DIAGNOSIS — B349 Viral infection, unspecified: Secondary | ICD-10-CM | POA: Insufficient documentation

## 2023-04-10 DIAGNOSIS — R059 Cough, unspecified: Secondary | ICD-10-CM | POA: Diagnosis not present

## 2023-04-10 LAB — COMPREHENSIVE METABOLIC PANEL
ALT: 119 U/L — ABNORMAL HIGH (ref 0–44)
AST: 146 U/L — ABNORMAL HIGH (ref 15–41)
Albumin: 3.6 g/dL (ref 3.5–5.0)
Alkaline Phosphatase: 81 U/L (ref 38–126)
Anion gap: 9 (ref 5–15)
BUN: 6 mg/dL (ref 6–20)
CO2: 25 mmol/L (ref 22–32)
Calcium: 9.4 mg/dL (ref 8.9–10.3)
Chloride: 106 mmol/L (ref 98–111)
Creatinine, Ser: 0.78 mg/dL (ref 0.61–1.24)
GFR, Estimated: >60 mL/min (ref >60)
Glucose, Bld: 118 mg/dL — ABNORMAL HIGH (ref 70–99)
Potassium: 3.2 mmol/L — ABNORMAL LOW (ref 3.5–5.1)
Sodium: 140 mmol/L (ref 135–145)
Total Bilirubin: 0.8 mg/dL (ref 0.3–1.2)
Total Protein: 7.5 g/dL (ref 6.5–8.1)

## 2023-04-10 LAB — URINALYSIS, ROUTINE W REFLEX MICROSCOPIC
Glucose, UA: NEGATIVE mg/dL
Hgb urine dipstick: NEGATIVE
Ketones, ur: NEGATIVE mg/dL
Leukocytes,Ua: NEGATIVE
Nitrite: NEGATIVE
Protein, ur: NEGATIVE mg/dL
Specific Gravity, Urine: 1.021 (ref 1.005–1.030)
pH: 5 (ref 5.0–8.0)

## 2023-04-10 LAB — SARS CORONAVIRUS 2 BY RT PCR: SARS Coronavirus 2 by RT PCR: NEGATIVE

## 2023-04-10 LAB — CBC WITH DIFFERENTIAL/PLATELET
Abs Immature Granulocytes: 0.06 10*3/uL (ref 0.00–0.07)
Basophils Absolute: 0.1 10*3/uL (ref 0.0–0.1)
Basophils Relative: 0 %
Eosinophils Absolute: 0.4 10*3/uL (ref 0.0–0.5)
Eosinophils Relative: 3 %
HCT: 38.6 % — ABNORMAL LOW (ref 39.0–52.0)
Hemoglobin: 12.7 g/dL — ABNORMAL LOW (ref 13.0–17.0)
Immature Granulocytes: 0 %
Lymphocytes Relative: 20 %
Lymphs Abs: 2.9 10*3/uL (ref 0.7–4.0)
MCH: 33.1 pg (ref 26.0–34.0)
MCHC: 32.9 g/dL (ref 30.0–36.0)
MCV: 100.5 fL — ABNORMAL HIGH (ref 80.0–100.0)
Monocytes Absolute: 0.7 10*3/uL (ref 0.1–1.0)
Monocytes Relative: 5 %
Neutro Abs: 10.3 10*3/uL — ABNORMAL HIGH (ref 1.7–7.7)
Neutrophils Relative %: 72 %
Platelets: 350 10*3/uL (ref 150–400)
RBC: 3.84 MIL/uL — ABNORMAL LOW (ref 4.22–5.81)
RDW: 12.1 % (ref 11.5–15.5)
WBC: 14.4 10*3/uL — ABNORMAL HIGH (ref 4.0–10.5)
nRBC: 0 % (ref 0.0–0.2)

## 2023-04-10 LAB — GROUP A STREP BY PCR: Group A Strep by PCR: DETECTED — AB

## 2023-04-10 MED ORDER — DEXAMETHASONE SODIUM PHOSPHATE 10 MG/ML IJ SOLN
10.0000 mg | Freq: Once | INTRAMUSCULAR | Status: AC
  Administered 2023-04-10: 10 mg via INTRAVENOUS
  Filled 2023-04-10: qty 1

## 2023-04-10 MED ORDER — PENICILLIN V POTASSIUM 500 MG PO TABS
500.0000 mg | ORAL_TABLET | Freq: Three times a day (TID) | ORAL | 0 refills | Status: DC
  Filled 2023-04-10: qty 29, 10d supply, fill #0

## 2023-04-10 MED ORDER — KETOROLAC TROMETHAMINE 15 MG/ML IJ SOLN
15.0000 mg | Freq: Once | INTRAMUSCULAR | Status: AC
  Administered 2023-04-10: 15 mg via INTRAVENOUS
  Filled 2023-04-10: qty 1

## 2023-04-10 MED ORDER — PENICILLIN V POTASSIUM 250 MG PO TABS
500.0000 mg | ORAL_TABLET | Freq: Once | ORAL | Status: AC
  Administered 2023-04-10: 500 mg via ORAL
  Filled 2023-04-10: qty 2

## 2023-04-10 MED ORDER — PENICILLIN V POTASSIUM 500 MG PO TABS
500.0000 mg | ORAL_TABLET | Freq: Three times a day (TID) | ORAL | 0 refills | Status: DC
  Filled 2023-04-10: qty 30, 10d supply, fill #0

## 2023-04-10 MED ORDER — SODIUM CHLORIDE 0.9 % IV BOLUS
1000.0000 mL | Freq: Once | INTRAVENOUS | Status: AC
  Administered 2023-04-10: 1000 mL via INTRAVENOUS

## 2023-04-10 NOTE — ED Notes (Signed)
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  He last use was friday

## 2023-04-10 NOTE — ED Triage Notes (Signed)
The pt is c/o body aches  he last took a tylenol  8-9 hours ago he is also c/o chills    productive cough dark sputum

## 2023-04-10 NOTE — ED Provider Notes (Signed)
[place] EMERGENCY DEPARTMENT AT [facility] Provider Note   CSN: [id number] Arrival date & time: 04/10/23  0222     History  Chief Complaint  Patient presents with   Generalized Body Aches    Shawn Arnold is a 37 y.o. male, history of alcohol use disorder, who presents to the ED secondary to chills, sore throat, cough, runny nose, and fevers for the last day.  He states that for the last day he just felt bad. States it also hurts to swallow, and is having difficulty swallowing.  Denies any current nausea, vomiting, chest pain, shortness of breath.  Is able to eat and drink.  Denies any sick contacts.    Home Medications Prior to Admission medications   Medication Sig Start Date End Date Taking? Authorizing Provider  penicillin v potassium (VEETID) 500 MG tablet Take 1 tablet (500 mg total) by mouth 3 (three) times daily. 04/10/23   Lorelai Huyser L, PA  predniSONE (STERAPRED UNI-PAK 21 TAB) 10 MG (21) TBPK tablet Take 6 tablets by mouth at once on day 1 and decrease by 1 tablet for each subsequent day 11/01/22   Poggi, Herb Grays, PA-C      Allergies    Patient has no known allergies.    Review of Systems   Review of Systems  Constitutional:  Positive for chills and fever.  HENT:  Positive for sore throat.   Respiratory:  Positive for cough. Negative for shortness of breath.     Physical Exam Updated Vital Signs BP (!) 142/93   Pulse 90   Temp 98.1 F (36.7 C)   Resp (!) 26   Ht 6\' 4"  (1.93 m)   Wt 94.6 kg   SpO2 97%   BMI 25.39 kg/m  Physical Exam Vitals and nursing note reviewed.  Constitutional:      General: He is not in acute distress.    Appearance: He is well-developed.  HENT:     Head: Normocephalic and atraumatic.     Right Ear: Tympanic membrane normal.     Left Ear: Tympanic membrane normal.     Nose: Nose normal.     Mouth/Throat:     Mouth: Mucous membranes are moist.     Pharynx: Posterior oropharyngeal erythema present. No oropharyngeal  exudate.     Comments: 3+ tonsils, no evidence of PTA or uvular deviation. No stridor Eyes:     Conjunctiva/sclera: Conjunctivae normal.  Cardiovascular:     Rate and Rhythm: Normal rate and regular rhythm.     Heart sounds: No murmur heard. Pulmonary:     Effort: Pulmonary effort is normal. No respiratory distress.     Breath sounds: Normal breath sounds.  Abdominal:     Palpations: Abdomen is soft.     Tenderness: There is no abdominal tenderness.  Musculoskeletal:        General: No swelling.     Cervical back: Neck supple.  Skin:    General: Skin is warm and dry.     Capillary Refill: Capillary refill takes less than 2 seconds.  Neurological:     Mental Status: [name].  Psychiatric:        Mood and Affect: Mood normal.     ED Results / Procedures / Treatments   Labs (all labs ordered are listed, but only abnormal results are displayed) Labs Reviewed  GROUP A STREP BY PCR - Abnormal; Notable for the following components:      Result Value  Group A Strep by PCR DETECTED (*)    All other components within normal limits  CBC WITH DIFFERENTIAL/PLATELET - Abnormal; Notable for the following components:   WBC 14.4 (*)    RBC 3.84 (*)    Hemoglobin 12.7 (*)    HCT 38.6 (*)    MCV 100.5 (*)    Neutro Abs 10.3 (*)    All other components within normal limits  COMPREHENSIVE METABOLIC PANEL - Abnormal; Notable for the following components:   Potassium 3.2 (*)    Glucose, Bld 118 (*)    AST 146 (*)    ALT 119 (*)    All other components within normal limits  URINALYSIS, ROUTINE W REFLEX MICROSCOPIC - Abnormal; Notable for the following components:   Color, Urine AMBER (*)    Bilirubin Urine Minnetta Sandora (*)    All other components within normal limits  SARS CORONAVIRUS 2 BY RT PCR    EKG None  Radiology DG Chest 2 View  Result Date: 04/10/2023 CLINICAL DATA:  Productive cough and fevers EXAM: CHEST - 2 VIEW COMPARISON:  01/28/2019 FINDINGS: The heart size and  mediastinal contours are within normal limits. Both lungs are clear. The visualized skeletal structures are unremarkable. IMPRESSION: No active cardiopulmonary disease. Electronically Signed   By: Alcide Clever M.D.   On: 04/10/2023 03:05    Procedures Procedures    Medications Ordered in ED Medications  dexamethasone (DECADRON) injection 10 mg (10 mg Intravenous Given 04/10/23 0447)  ketorolac (TORADOL) 15 MG/ML injection 15 mg (15 mg Intravenous Given 04/10/23 0447)  sodium chloride 0.9 % bolus 1,000 mL (1,000 mLs Intravenous New Bag/Given 04/10/23 0448)  penicillin v potassium (VEETID) tablet 500 mg (500 mg Oral Given 04/10/23 0436)    ED Course/ Medical Decision Making/ A&P                             Medical Decision Making Patient is a 37 year old male, here for sore throat, fevers, chills, cough for the last day.  Will obtain labs, chest x-ray, strep testing for further evaluation  Amount and/or Complexity of Data Reviewed Labs: ordered.    Details: Strep positive, COVID-negative, white blood cell count of 14.4k Radiology: ordered.    Details: X-ray clear Discussion of management or test interpretation with external provider(s): Discussed with patient, likely secondary to his trip, and a viral infection given his systemic symptoms.  Will treat him with penicillin, I encouraged her to take Tylenol and ibuprofen at home for pain control.  We discussed return precautions and he voiced understanding.  He is not have any evidence of any kind of uvular deviation, exudate, he is able to eat and drink, is feeling better after the dexamethasone, Toradol.  And is requesting to go home.  Discharged with strict return precautions.  Risk Prescription drug management.   Final Clinical Impression(s) / ED Diagnoses Final diagnoses:  Strep pharyngitis  Viral illness    Rx / DC Orders ED Discharge Orders          Ordered    penicillin v potassium (VEETID) 500 MG tablet  3 times daily,    Status:  Discontinued        04/10/23 0439    penicillin v potassium (VEETID) 500 MG tablet  3 times daily        04/10/23 0440              Anees Vanecek, Harley Alto, PA 04/10/23 [phone]  Gilda Crease, MD 04/11/23 0104

## 2023-04-10 NOTE — Discharge Instructions (Addendum)
Please follow-up with your primary care doctor, make sure you are taking the antibiotics as prescribed, and return to the ER immediately if you feel like you are cannot swallow, you have difficulty breathing, or you feel like your throat is closing up.  Make sure you are taking ibuprofen, Tylenol for your pain control.  And rest.

## 2023-04-25 ENCOUNTER — Other Ambulatory Visit: Payer: Self-pay

## 2023-06-17 ENCOUNTER — Other Ambulatory Visit: Payer: Self-pay

## 2023-07-13 DIAGNOSIS — F10129 Alcohol abuse with intoxication, unspecified: Secondary | ICD-10-CM | POA: Diagnosis not present

## 2023-07-14 DIAGNOSIS — F17210 Nicotine dependence, cigarettes, uncomplicated: Secondary | ICD-10-CM | POA: Diagnosis not present

## 2023-07-14 DIAGNOSIS — F10129 Alcohol abuse with intoxication, unspecified: Secondary | ICD-10-CM | POA: Diagnosis not present

## 2023-07-14 DIAGNOSIS — F1020 Alcohol dependence, uncomplicated: Secondary | ICD-10-CM | POA: Diagnosis not present

## 2023-07-14 DIAGNOSIS — F10230 Alcohol dependence with withdrawal, uncomplicated: Secondary | ICD-10-CM | POA: Diagnosis not present

## 2023-07-14 DIAGNOSIS — F1220 Cannabis dependence, uncomplicated: Secondary | ICD-10-CM | POA: Diagnosis not present

## 2023-07-14 DIAGNOSIS — K047 Periapical abscess without sinus: Secondary | ICD-10-CM | POA: Diagnosis not present

## 2023-07-14 DIAGNOSIS — Y901 Blood alcohol level of 20-39 mg/100 ml: Secondary | ICD-10-CM | POA: Diagnosis not present

## 2023-07-14 DIAGNOSIS — F10220 Alcohol dependence with intoxication, uncomplicated: Secondary | ICD-10-CM | POA: Diagnosis not present

## 2023-07-15 DIAGNOSIS — F1020 Alcohol dependence, uncomplicated: Secondary | ICD-10-CM | POA: Diagnosis not present

## 2023-07-15 DIAGNOSIS — F1220 Cannabis dependence, uncomplicated: Secondary | ICD-10-CM | POA: Diagnosis not present

## 2023-07-15 DIAGNOSIS — F17210 Nicotine dependence, cigarettes, uncomplicated: Secondary | ICD-10-CM | POA: Diagnosis not present

## 2023-07-16 DIAGNOSIS — F17210 Nicotine dependence, cigarettes, uncomplicated: Secondary | ICD-10-CM | POA: Diagnosis not present

## 2023-07-16 DIAGNOSIS — F1220 Cannabis dependence, uncomplicated: Secondary | ICD-10-CM | POA: Diagnosis not present

## 2023-07-16 DIAGNOSIS — F1020 Alcohol dependence, uncomplicated: Secondary | ICD-10-CM | POA: Diagnosis not present

## 2023-07-17 DIAGNOSIS — F1220 Cannabis dependence, uncomplicated: Secondary | ICD-10-CM | POA: Diagnosis not present

## 2023-07-17 DIAGNOSIS — F17210 Nicotine dependence, cigarettes, uncomplicated: Secondary | ICD-10-CM | POA: Diagnosis not present

## 2023-07-17 DIAGNOSIS — F1020 Alcohol dependence, uncomplicated: Secondary | ICD-10-CM | POA: Diagnosis not present

## 2023-07-21 ENCOUNTER — Telehealth: Payer: Self-pay | Admitting: *Deleted

## 2023-07-21 DIAGNOSIS — F1420 Cocaine dependence, uncomplicated: Secondary | ICD-10-CM | POA: Diagnosis not present

## 2023-07-21 NOTE — Transitions of Care (Post Inpatient/ED Visit) (Signed)
07/21/2023  Name: Shawn Arnold MRN: [id number] DOB: 1986/09/22  Today's TOC FU Call Status: Today's TOC FU Call Status:: Unsuccessful Call (1st Attempt) Unsuccessful Call (1st Attempt) Date: 07/21/23  Attempted to reach the patient regarding the most recent Inpatient/ED visit.  Follow Up Plan: Additional outreach attempts will be made to reach the patient to complete the Transitions of Care (Post Inpatient/ED visit) call.   Gean Maidens BSN RN [facility] [phone]

## 2023-07-25 DIAGNOSIS — F1010 Alcohol abuse, uncomplicated: Secondary | ICD-10-CM | POA: Diagnosis not present

## 2023-07-25 DIAGNOSIS — J45909 Unspecified asthma, uncomplicated: Secondary | ICD-10-CM | POA: Diagnosis not present

## 2023-07-25 DIAGNOSIS — Z87891 Personal history of nicotine dependence: Secondary | ICD-10-CM | POA: Diagnosis not present

## 2023-07-25 DIAGNOSIS — R030 Elevated blood-pressure reading, without diagnosis of hypertension: Secondary | ICD-10-CM | POA: Diagnosis not present

## 2023-07-25 DIAGNOSIS — K0889 Other specified disorders of teeth and supporting structures: Secondary | ICD-10-CM | POA: Diagnosis not present

## 2023-07-25 DIAGNOSIS — K029 Dental caries, unspecified: Secondary | ICD-10-CM | POA: Diagnosis not present

## 2024-06-11 ENCOUNTER — Other Ambulatory Visit: Payer: Self-pay

## 2024-06-11 ENCOUNTER — Emergency Department (HOSPITAL_BASED_OUTPATIENT_CLINIC_OR_DEPARTMENT_OTHER)
Admission: EM | Admit: 2024-06-11 | Discharge: 2024-06-11 | Disposition: A | Discharge status: Home / Self Care | Payer: MEDICAID

## 2024-06-11 ENCOUNTER — Encounter (HOSPITAL_BASED_OUTPATIENT_CLINIC_OR_DEPARTMENT_OTHER): Payer: Self-pay | Admitting: Emergency Medicine

## 2024-06-11 DIAGNOSIS — R42 Dizziness and giddiness: Secondary | ICD-10-CM | POA: Diagnosis present

## 2024-06-11 DIAGNOSIS — R112 Nausea with vomiting, unspecified: Secondary | ICD-10-CM | POA: Diagnosis not present

## 2024-06-11 LAB — CBC
HCT: 44.5 % (ref 39.0–52.0)
Hemoglobin: 15.1 g/dL (ref 13.0–17.0)
MCH: 33 pg (ref 26.0–34.0)
MCHC: 33.9 g/dL (ref 30.0–36.0)
MCV: 97.2 fL (ref 80.0–100.0)
Platelets: 304 K/uL (ref 150–400)
RBC: 4.58 MIL/uL (ref 4.22–5.81)
RDW: 12.5 % (ref 11.5–15.5)
WBC: 8.4 K/uL (ref 4.0–10.5)
nRBC: 0 % (ref 0.0–0.2)

## 2024-06-11 LAB — URINALYSIS, MICROSCOPIC (REFLEX): RBC / HPF: NONE SEEN RBC/hpf (ref 0–5)

## 2024-06-11 LAB — COMPREHENSIVE METABOLIC PANEL WITH GFR
ALT: 6 U/L (ref 0–44)
AST: 19 U/L (ref 15–41)
Albumin: 4.3 g/dL (ref 3.5–5.0)
Alkaline Phosphatase: 55 U/L (ref 38–126)
Anion gap: 12 (ref 5–15)
BUN: 6 mg/dL (ref 6–20)
CO2: 25 mmol/L (ref 22–32)
Calcium: 9.1 mg/dL (ref 8.9–10.3)
Chloride: 104 mmol/L (ref 98–111)
Creatinine, Ser: 0.98 mg/dL (ref 0.61–1.24)
GFR, Estimated: >60 mL/min (ref >60)
Glucose, Bld: 99 mg/dL (ref 70–99)
Potassium: 4.1 mmol/L (ref 3.5–5.1)
Sodium: 141 mmol/L (ref 135–145)
Total Bilirubin: 0.9 mg/dL (ref 0.0–1.2)
Total Protein: 7.1 g/dL (ref 6.5–8.1)

## 2024-06-11 LAB — RESP PANEL BY RT-PCR (RSV, FLU A&B, COVID)  RVPGX2
Influenza A by PCR: NEGATIVE
Influenza B by PCR: NEGATIVE
Resp Syncytial Virus by PCR: NEGATIVE
SARS Coronavirus 2 by RT PCR: NEGATIVE

## 2024-06-11 LAB — URINALYSIS, ROUTINE W REFLEX MICROSCOPIC
Glucose, UA: NEGATIVE mg/dL
Hgb urine dipstick: NEGATIVE
Ketones, ur: NEGATIVE mg/dL
Leukocytes,Ua: NEGATIVE
Nitrite: NEGATIVE
Protein, ur: 30 mg/dL — AB
Specific Gravity, Urine: 1.015 (ref 1.005–1.030)
pH: 7.5 (ref 5.0–8.0)

## 2024-06-11 LAB — LIPASE, BLOOD: Lipase: 36 U/L (ref 11–51)

## 2024-06-11 MED ORDER — ONDANSETRON HCL 4 MG/2ML IJ SOLN
4.0000 mg | Freq: Once | INTRAMUSCULAR | Status: AC
  Administered 2024-06-11: 4 mg via INTRAVENOUS
  Filled 2024-06-11: qty 2

## 2024-06-11 MED ORDER — SODIUM CHLORIDE 0.9 % IV BOLUS
1000.0000 mL | Freq: Once | INTRAVENOUS | Status: AC
  Administered 2024-06-11: 1000 mL via INTRAVENOUS

## 2024-06-11 MED ORDER — ONDANSETRON HCL 4 MG PO TABS: 4.0000 mg | ORAL_TABLET | Freq: Three times a day (TID) | ORAL | 0 refills | Status: AC | PRN

## 2024-06-11 NOTE — Discharge Instructions (Signed)
 Drink lots of fluids.  Take any over-the-counter medications as needed from her symptoms.  Use your Zofran  as needed for nausea and vomiting.

## 2024-06-11 NOTE — ED Triage Notes (Signed)
 Pt via POV c/o dizziness and nausea that started this afternoon while he was at work. Pt vomited once PTA.

## 2024-06-11 NOTE — ED Provider Notes (Signed)
 [place] EM[facility] DEPARTMENT AT [facility] Provider Note   CSN: [id number] Arrival date & time: 06/11/24  2051     Patient presents with: Dizziness   Shawn Arnold is a 38 y.o. male.   38 year old male presents for evaluation of dizziness.  States he got to work and started feeling lightheaded dizzy and nauseous.  States he threw up 3 times.  States he still feels dizzy.  Denies any other symptoms or concerns.  Denies any shortness of breath or chest pain.   Dizziness Associated symptoms: nausea and vomiting   Associated symptoms: no chest pain, no palpitations and no shortness of breath        Prior to Admission medications   Medication Sig Start Date End Date Taking? Authorizing Provider  ondansetron  (ZOFRAN ) 4 MG tablet Take 1 tablet (4 mg total) by mouth every 8 (eight) hours as needed for up to 4 days for nausea or vomiting. 06/11/24 06/15/24 Yes Terral Cooks L, DO  penicillin  v potassium (VEETID) 500 MG tablet Take 1 tablet (500 mg total) by mouth 3 (three) times daily. 04/10/23   Small, Brooke L, PA  predniSONE  (STERAPRED UNI-PAK 21 TAB) 10 MG (21) TBPK tablet Take 6 tablets by mouth at once on day 1 and decrease by 1 tablet for each subsequent day 11/01/22   Poggi, Jenna E, PA-C    Allergies: Patient has no known allergies.    Review of Systems  Constitutional:  Negative for chills and fever.  HENT:  Negative for ear pain and sore throat.   Eyes:  Negative for pain and visual disturbance.  Respiratory:  Negative for cough and shortness of breath.   Cardiovascular:  Negative for chest pain and palpitations.  Gastrointestinal:  Positive for nausea and vomiting. Negative for abdominal pain.  Genitourinary:  Negative for dysuria and hematuria.  Musculoskeletal:  Negative for arthralgias and back pain.  Skin:  Negative for color change and rash.  Neurological:  Positive for dizziness. Negative for seizures and syncope.  All other systems reviewed and are  negative.   Updated Vital Signs BP 126/89 (BP Location: Left Arm)   Pulse 98   Temp 97.9 F (36.6 C) (Oral)   Resp 16   Ht 6' 2 (1.88 m)   Wt 81.6 kg   SpO2 98%   BMI 23.11 kg/m   Physical Exam Vitals and nursing note reviewed.  Constitutional:      General: He is not in acute distress.    Appearance: Normal appearance. He is well-developed. He is not ill-appearing.  HENT:     Head: Normocephalic and atraumatic.  Eyes:     Conjunctiva/sclera: Conjunctivae normal.  Cardiovascular:     Rate and Rhythm: Normal rate and regular rhythm.     Pulses: Normal pulses.     Heart sounds: Normal heart sounds. No murmur heard. Pulmonary:     Effort: Pulmonary effort is normal. No respiratory distress.     Breath sounds: Normal breath sounds.  Abdominal:     General: There is no distension.     Palpations: Abdomen is soft. There is no mass.     Tenderness: There is no abdominal tenderness.     Hernia: No hernia is present.  Musculoskeletal:        General: No swelling.     Cervical back: Neck supple.  Skin:    General: Skin is warm and dry.     Capillary Refill: Capillary refill takes less than 2 seconds.  Neurological:  Mental Status: [name].  Psychiatric:        Mood and Affect: Mood normal.     (all labs ordered are listed, but only abnormal results are displayed) Labs Reviewed  URINALYSIS, ROUTINE W REFLEX MICROSCOPIC - Abnormal; Notable for the following components:      Result Value   Bilirubin Urine SMALL (*)    Protein, ur 30 (*)    All other components within normal limits  URINALYSIS, MICROSCOPIC (REFLEX) - Abnormal; Notable for the following components:   Bacteria, UA RARE (*)    All other components within normal limits  RESP PANEL BY RT-PCR (RSV, FLU A&B, COVID)  [id number]  LIPASE, BLOOD  COMPREHENSIVE METABOLIC PANEL WITH GFR  CBC    EKG: EKG Interpretation Date/Time:  Friday June 11 2024 21:47:32 EDT Ventricular Rate:  87 PR  Interval:  173 QRS Duration:  99 QT Interval:  384 QTC Calculation: 462 R Axis:   92  Text Interpretation: Sinus rhythm Consider left atrial enlargement Borderline right axis deviation Compared with prior EKG from 03/14/2018 Confirmed by Gennaro Bouchard ([id number]) on 06/11/2024 9:51:41 PM  Radiology: No results found.   Procedures   Medications Ordered in the ED  sodium chloride  0.9 % bolus 1,000 mL (0 mLs Intravenous Stopped 06/11/24 2244)  ondansetron  (ZOFRAN ) injection 4 mg (4 mg Intravenous Given 06/11/24 2140)                                    Medical Decision Making Cardiac monitor interpretation: Sinus rhythm, no ectopy  Patient here for episode of dizziness and nausea and vomiting at work today.  Vitals are stable and he is feeling much improved after Zofran  and fluids.  Lab workup reviewed and unremarkable.  I will give him prescription for Zofran  to use as needed and he is advised to increase his fluid intake use Tylenol  Motrin  as needed for pain.  He feels comfortable being discharged home.  Problems Addressed: Dizziness: acute illness or injury Nausea and vomiting, unspecified vomiting type: acute illness or injury  Amount and/or Complexity of Data Reviewed External Data Reviewed: notes.    Details: No prior records for review Labs: ordered. Decision-making details documented in ED Course.    Details: Ordered and reviewed by me and unremarkable, no leukocytosis, electrolytes and creatinine within normal limits ECG/medicine tests: ordered and independent interpretation performed. Decision-making details documented in ED Course.    Details: Ordered and interpreted by me in the absence of cardiology shows sinus rhythm, no STEMI no acute change when compared to prior EKG  Risk OTC drugs. Prescription drug management. Drug therapy requiring intensive monitoring for toxicity.     Final diagnoses:  Dizziness  Nausea and vomiting, unspecified vomiting type    ED  Discharge Orders          Ordered    ondansetron  (ZOFRAN ) 4 MG tablet  Every 8 hours PRN        06/11/24 2230               Bleu Moisan L, DO 06/11/24 2249

## 2024-07-01 NOTE — Progress Notes (Signed)
 Mental Health Screener Initial Note  Reason for Consult: suicidal ideation and alcohol  use  Shawn Arnold is a 38 y.o., male who presented at [facility] -  Emergency Department due to Suicidal Ideation (IVC)  Pt presents unaccompanied via law enforcement after being petitioned for involuntary commitment by his wife, Shawn Arnold [phone]. Affidavit and petition states: 06/29/24 - Sent text saying he cannot live like this anymore. Kept repeating he is going to kill himself. Excessive drinking daily, 10-15 beers Darrel hard) daily. Aggressive, unable to stand, walk straight. Said I am going to drink myself to death. Said I am going to kill myself tonight. Removed any sharp objects in the home for his safety.  Pt insists he does not understand why he was petitioned for involuntary commitment. He reports when [employer] showed up to his house, he was on the porch laughing with his mom. He admits to texting his mother he was going to drink himself to death but says he did this to get at her because she was complaining that he was not eating. Pt says, I told her if I was going to die, I would do it on an empty stomach. He acknowledges he has not been eating as much because he does not have an appetite. He insists that he has not been experiencing suicidal thoughts and he denies any history of suicide attempts. He denies history of intentional self-harm. He denies feeling depressed recently but does acknowledge grieving the death of his brother, who died of a drug-related incident 5 years ago  He says he works 12-hour shifts and often does not sleep enough. He denies homicidal ideation or history of aggression. He denies auditory or visual hallucinations.   Pt acknowledges a long history of alcohol  use, stating he began drinking at age 57. He says he drinks approximately 6 cans of Mike's Hard Lemonade daily, with last drink earlier this evening. He  acknowledges he experiences withdrawal symptoms when he stops drinking, including tremors, abdominal pain, nausea, and diarrhea. He denies history of seizures. He says he smokes 3-4 puffs of marijuana daily. He denies other substance use. Alcohol  level is 262 and drug screens is positive for marijuana.  Pt says he lives with his wife. He states he has two children from a previous relationship who live with their mother in [place]. 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 He says he was pulled over and charged with a DUI but was never was given a breathalyzer and is going to court. He denies history of abuse. He denies access to firearms, explaining he is a convicted felon.  Pt says he has no outpatient mental health providers. He says he does not have a primary care provider and does not take medications. He acknowledges he has been admitted for alcohol  detox in the past, most recently at this facility 09/15-09/19/2024. He has also received treatment at [facility].  This [facility] attempt to obtain collateral information from Pt's wife, Shawn Arnold, at [phone] but there was no answer.  Pt is dressed in hospital scrubs, slightly drowsy, and oriented x4. Pt speaks in a clear tone, at moderate volume, and normal pace. Motor behavior appears normal. Eye contact is fair. Pt's mood is frustrated and affect is congruent with mood. Thought process is coherent and relevant. There is no indication he is responding to internal stimuli or experiencing delusional thought content. He is cooperative but feels being brought to the emergency  department for evaluation was unnecessary. He says he does not want to be admitted for alcohol  detox, adding that he has to go to work.  Diagnosis: F10.20 Alcohol  use disorder, Severe  Recommendation: Collateral information needs to be obtained from petitioner. Recommend Pt be observed overnight and evaluated by psychiatry in the morning. Notified Olita Duncan, MD and Melody GORMAN Rubenstein, RN of recommendation via secure message.  Pt Information:  Suicidal Ideation (Lifetime/Recent) 1. Have you wished you were dead or wished you could go to sleep and not wake up? (Lifetime): Yes Details: Wish you were dead (Lifetime): Pt admits to texting his mother he was going to drink himself to death. He denies history of suicide attempts. 1. Have you wished you were dead or wished you could go to sleep and not wake up? (Past 1 Month): Yes Details: Wish you were dead (Past 1 Month): Pt admits to texting his mother he was going to drink himself to death. He denies history of suicide attempts. 2. Have you actually had any thoughts of killing yourself? (Lifetime): Yes Details: Thoughts of killing yourself (Lifetime): Pt admits to texting his mother he was going to drink himself to death. He denies history of suicide attempts. 2. Have you actually had any thoughts of killing yourself? (Past 1 Month): Yes Details: Thoughts of killing yourself (Past 1 Month): Pt admits to texting his mother he was going to drink himself to death. He denies history of suicide attempts. 3. Have you been thinking about how you might do this? (Lifetime): Yes Details: Thoughts of how you might kill yourself (Lifetime): Pt admits to texting his mother he was going to drink himself to death. He denies history of suicide attempts. 3. Have you been thinking about how you might do this? (Past 1 Month): Yes Details: Thoughts of how you might kill yourself (Past 1 Month): Pt admits to texting his mother he was going to drink himself to death. He denies history of suicide attempts. 4. Have you had these thoughts and had some intention of acting on them? (Lifetime): No 4. Have you had these thoughts and had some intention of acting on them? (Past 1 Month): No 5. Have you started to work out or worked out the details of how to kill yourself? Do you intend to carry out this plan? (Lifetime): No 5. Have you started to work  out or worked out the details of how to kill yourself? Do you intend to carry out this plan? (Past 1 Month): No Intensity of Ideation Most Severe Ideation Rating (Lifetime): 3 Description of Most Severe Ideation (Lifetime): Pt admits to texting his mother he was going to drink himself to death. He denies history of suicide attempts. Most Severe Ideation Rating (Past 1 Month): 3 Description of Most Severe Ideation (Past 1 Month): Pt admits to texting his mother he was going to drink himself to death. He denies history of suicide attempts. Frequency (Lifetime): Less than once a week Frequency (Past 1 Month): Less than once a week Duration (Lifetime): Fleeting, few seconds or minutes Duration (Past 1 Month): Fleeting, few seconds or minutes Controllability (Lifetime): Easily able to control thoughts Controllability (Past 1 Month): Easily able to control thoughts Deterrents (Lifetime): Deterrents definitely stopped you from attempting suicide Deterrents (Past 1 Month): Deterrents definitely stopped you from attempting suicide Reasons for Ideation (Lifetime): Mostly to get attention, revenge, or a reaction from others Reasons for Ideation (Past 1 Month): Mostly to get attention, revenge, or a reaction from others  Suicidal Behavior Actual Attempt (Lifetime): No Has subject engaged in non-suicidal self-injurious behavior? (Lifetime): No Interrupted Attempts (Lifetime): No Aborted or Self-Interrupted Attempt (Lifetime): No Preparatory Acts or Behavior (Lifetime): No   Risk Assessment with CSSRS Triage Indicators Suicidal and Self-Injurious Behavior from CSSRS:  (Pt denies) Suicidal Ideation from CSSRS - Most Severe Past Month: Suicidal thoughts Recent Activating Events: Recent loss(es) or other significant negative events Recent Events Details: Pt says he is grieving his brother's death Treatment History: Previous psychiatric diagnoses and treatments, Not receiving treatment Other Risk Factors:  Other (see comments) (None) Recent Clinical Status: Substance abuse or dependence Recent Protective Factors: Identifies reasons for living, Engaged in work or school, Responsibility to family or others, living with family, Fear of death or dying due to pain and suffering, Supportive social network or family     Risk to Others Access to Firearms/Weapons: No History of Harm to Others: No  Thought Content General Thought Content: Goal directed Delusions: None Visual Disturbances: Other (None reported) Hallucinations: None Thought Process: Goal directed Arousal: Alert Orientation: Fully oriented Attention Capacity: Normal Language Ability: Unremarkable Interpersonal Style: Rapport easily established Mood: Frustrated Affect: Frustrated  Sleep ADLs Number hours of sleep in a 24 hour period: 5 Problems with Sleep?: Yes Sleep Problems: Frequent awakening Sleep pattern changes: Not Changed Amount of sleep has: Not changed  Diet ADLs Problems with eating or appetite?: Yes Eating/Appetite problems: Skipping meals Weight gain or loss?: N/A Is patient on a special diet (ordered by MD)?: No  Accommodation ADLs Do you require any accommodations for special needs?: No  Social Considerations Are there any cultural/spiritual practices impacting your treatment?: No Recent Stressors:  (Grieving death of brother)  Education Highest Grade Completed: 11 Currently Attending School: No Employment Employment Status: Employed Recent changes to employment status?: No Means of financial support: Educational Psychologist barriers to treatment/medication?: No Does patient request referral for pharmacy/financial counselors with current treatment?: No Does patient have a payee?: No  Current Treatment Provider/Agency 1 Current Treatment Provider/Agency:  (None)  Family and Relationships Type of residence:: Private residence Lives With: Spouse Primary family: Supportive Absence of  Interpersonal Relationships: N/A Current Relationship/Marital Status: Married-civil  Over the past 2 weeks, how often have you been bothered by any of the following problems? Little interest or pleasure in doing things: Not at all Feeling down, depressed, or hopeless: Several days Depression Risk: 1 Over the past 2 weeks, how often have you been bothered by any of the following problems? Trouble falling or staying asleep, or sleeping too much: Several days Feeling tired or having little energy: Several days Poor appetite or overeating: Several days Feeling bad about yourself - or that you are a failure or have let yourself or your family down: Not at all Trouble concentrating on things, such as reading the newspaper or watching television: Not at all Moving or speaking so slowly that other people could have noticed? Or the opposite - being so fidgety or restless that you have been moving around a lot more than usual.: Not at all Thoughts that you would be better off dead or hurting yourself in some way: Not at all Patient Health Questionnaire-9 Score: 4 If you checked off any problems on this questionnaire: How difficult have these problems made it for you to do your work, take care of things at home, or get along with other people?: Somewhat difficult  Past Psychiatric History  . Hospitalizations/ED Visits Yes   . Hospitalization/ED Visit Details Pt has been hospitalized for  alcohol  detox   . OP Providers na   . Past and current psychiatric diagnoses F10.20 Alcohol  use disorder, severe   . Psychiatric Medications na   . Non-pharmacological Psychiatric Treatment/Brain Stimulation na   . Aggressive Behaviors No   . Suicide Attempts No   . Non-suicidal Self Injury No    Social History   Substance and Sexual Activity  Drug Use Yes  . Frequency: 7.0 times per week  . Types: Marijuana   Comment: Pt reports smoking 3-4 puffs of marijuana daily   Family History[1]  Withdrawal  Symptoms Does the patient have current and/or history of withdrawal symptoms?: Yes Does the patient have a history of withdrawal seizures?: No Current Withdrawal Symptoms:  (None currently) Historical Withdrawal Symptoms: Nausea, Diarrhea/ Abdominal Cramps, Tremors, Not Able to Sleep (Abdominal pain) Signs and Symptoms of Dependency Does the patient have signs/symptoms of dependency?: Yes Loss of Control Over Amount: Yes Efforts to cut back/control use: Yes More time getting, using, or recovering: Yes Cravings and urges to use the substance: Yes Unable to fulfill roles at work/home/school: Yes Continued use despite social problems: Yes Changed social/recreational activities: Yes Repeated use when physically hazardous: Yes Continued use when aware causing problems: Yes Tolerance: Yes Development of withdrawal symptoms: Yes   Past Psychotropic Medications     Name/Dose Start/Stop Date Effectiveness/Side Effects    none         Legal History Current Legal Status: Other (Comment) Most Recent Offense Category: Other (Comment) Legal History Incident #1: DUI Legal History Year #1: 2025 Legal History Disposition #1: Date pending  Military History Have you ever been in the [employer]?: No Do you have immediate family currently serving or has served in the past?: No  Psychological Trauma History Psychological Trauma History (Current or Past): No                  [1] No family history on file.

## 2024-08-20 NOTE — ED Notes (Signed)
 CALLED DIETARY FOR TRAY   Deward Elsie Glatter, RN 08/20/24 [phone]

## 2024-08-20 NOTE — ED Provider Notes (Signed)
 11:50 PM  This patient was seen and evaluated by me after a full presentation from Leonor Hope, PA-C APP.  Chief complaint of request for detox.  Patient claims he drinks over 10 mike's hard lemonade per day.  He has been to detox before and went to rehab previously but only stayed 7 days and quickly relapsed.  Denies any SI/HI or hallucinations.    Physical Exam Vitals and nursing note reviewed.  Constitutional:      Appearance: Normal appearance.  HENT:     Mouth/Throat:     Mouth: Mucous membranes are moist.  Eyes:     Extraocular Movements: Extraocular movements intact.     Pupils: Pupils are equal, round, and reactive to light.  Cardiovascular:     Rate and Rhythm: Normal rate and regular rhythm.     Pulses: Normal pulses.  Pulmonary:     Effort: Pulmonary effort is normal.     Breath sounds: Normal breath sounds.  Abdominal:     Tenderness: There is no abdominal tenderness.  Skin:    General: Skin is warm and dry.     Capillary Refill: Capillary refill takes less than 2 seconds.  Neurological:     Mental Status: [name].     GCS: GCS eye subscore is 4. GCS verbal subscore is 5. GCS motor subscore is 6.  Psychiatric:        Attention and Perception: Attention normal.        Mood and Affect: Mood is depressed.        Speech: Speech normal.        Thought Content: Thought content does not include homicidal or suicidal ideation.        Cognition and Memory: Cognition normal.        Judgment: Judgment is impulsive and inappropriate.      Abnormal Labs Reviewed  COMPREHENSIVE METABOLIC PANEL - Abnormal; Notable for the following components:      Result Value   Sodium 135 (*)    Potassium 3.1 (*)    Blood Urea Nitrogen (BUN) 5 (*)    All other components within normal limits  URINALYSIS WITH REFLEX TO MICROSCOPIC - Abnormal; Notable for the following components:   Urobilinogen, Urine 2.0 (*)    All other components within normal limits   Narrative:     Microscopic not indicated  ETHANOL - Abnormal; Notable for the following components:   Ethanol 106 (*)    All other components within normal limits  DRUG OF ABUSE 7 PANEL - Abnormal; Notable for the following components:   Marijuana (THC) Screen, Urine Positive (*)    All other components within normal limits   Narrative:    These results are for medical purposes only.  The screening procedure aims to detect the presence of drugs and it is regarded as presumptive (qualitative analysis).  Positive screening detection requires confirmation using more sensitive and specific quantitative methods, such as high performance liquid chromatography and mass spectrometry.  CBC WITH DIFFERENTIAL - Abnormal; Notable for the following components:   RBC 4.13 (*)    Hematocrit 40.8 (*)    Mean Corpuscular Volume (MCV) 98.6 (*)    Mean Corpuscular Hemoglobin (MCH) 34.4 (*)    Mean Platelet Volume (MPV) 6.7 (*)    All other components within normal limits    The patient has been placed in psychiatric observation due to the need to provide a safe environment for the patient while obtaining psychiatric consultation and evaluation,  as well as ongoing medical and medication management to treat the patient's condition.  The patient has not been placed under full IVC at this time.    Medical decision making My interpretation of lab work reveals positive for marijuana.  His alcohol  is mildly elevated at 106.  Urinalysis reveals no infection.  CBC indicated Macrocytic cells and CMP has mild hypokalemia and mild hyponatremia with no elevation of LFTs.  At 1815 hours case was discussed with Assessment team, [facility], and patient does not meet their detox criteria given his lack of symptoms.  Patient will be discharged with resource information.   1. Alcohol  use Active  2. Marijuana use Active  3. Hypokalemia Active  4. Hyponatremia Active     Please note this is a shared service visit with Leonor Hope,  PA-C APP  Shared service with APP.  I have personally seen and examined the patient, providing direct face to face care. I performed a substantive portion of the medical decision making for the patient encounter, as documented by the APP.    Pt is a 38 y.o. male who presented with request for detox evaluation. Pt appears to have alcohol  use and mild electrolyte abnormalities. Plan is medical clearance followed by assessment team evaluation.  DISCHARGE MEDICATIONS   Medication List    You have not been prescribed any medications.     FOLLOW UP [facility] -  EMERGENCY DEPARTMENT [address] [employer] [place]  [address] [phone]  As needed, If symptoms worsen   _____________________________ Portions of this note were dictated using Dragon software.  It has been reviewed for accuracy, but may contain grammatical and clerical errors.  Noralyn Drivers, MD 08/20/2024 4:02 PM

## 2024-08-20 NOTE — ED Notes (Signed)
 Pt had dinner    Annabella Gosling, CNA 08/20/24 1755

## 2024-08-29 ENCOUNTER — Ambulatory Visit (HOSPITAL_COMMUNITY)
Admission: EM | Admit: 2024-08-29 | Discharge: 2024-08-30 | Disposition: A | Discharge status: Other Acute Inpatient Hospital | Payer: MEDICAID | Attending: Nurse Practitioner | Admitting: Nurse Practitioner

## 2024-08-29 DIAGNOSIS — F1010 Alcohol abuse, uncomplicated: Secondary | ICD-10-CM | POA: Insufficient documentation

## 2024-08-29 MED ORDER — LORAZEPAM 2 MG/ML IJ SOLN: 2.0000 mg | Freq: Three times a day (TID) | INTRAMUSCULAR | Status: DC | PRN

## 2024-08-29 MED ORDER — ACETAMINOPHEN 325 MG PO TABS: 650.0000 mg | ORAL_TABLET | Freq: Four times a day (QID) | ORAL | Status: DC | PRN

## 2024-08-29 MED ORDER — DIPHENHYDRAMINE HCL 50 MG/ML IJ SOLN: 50.0000 mg | Freq: Three times a day (TID) | INTRAMUSCULAR | Status: DC | PRN

## 2024-08-29 MED ORDER — HYDROXYZINE HCL 25 MG PO TABS: 25.0000 mg | ORAL_TABLET | Freq: Three times a day (TID) | ORAL | Status: DC | PRN

## 2024-08-29 MED ORDER — HALOPERIDOL 5 MG PO TABS: 5.0000 mg | ORAL_TABLET | Freq: Three times a day (TID) | ORAL | Status: DC | PRN

## 2024-08-29 MED ORDER — HALOPERIDOL LACTATE 5 MG/ML IJ SOLN: 10.0000 mg | Freq: Three times a day (TID) | INTRAMUSCULAR | Status: DC | PRN

## 2024-08-29 MED ORDER — TRAZODONE HCL 50 MG PO TABS: 50.0000 mg | ORAL_TABLET | Freq: Every evening | ORAL | Status: DC | PRN

## 2024-08-29 MED ORDER — MAGNESIUM HYDROXIDE 400 MG/5ML PO SUSP: 30.0000 mL | Freq: Every day | ORAL | Status: DC | PRN

## 2024-08-29 MED ORDER — ALUM & MAG HYDROXIDE-SIMETH 200-200-20 MG/5ML PO SUSP: 30.0000 mL | ORAL | Status: DC | PRN

## 2024-08-29 MED ORDER — DIPHENHYDRAMINE HCL 50 MG PO CAPS: 50.0000 mg | ORAL_CAPSULE | Freq: Three times a day (TID) | ORAL | Status: DC | PRN

## 2024-08-29 MED ORDER — HALOPERIDOL LACTATE 5 MG/ML IJ SOLN: 5.0000 mg | Freq: Three times a day (TID) | INTRAMUSCULAR | Status: DC | PRN

## 2024-08-29 NOTE — ED Provider Notes (Incomplete)
 Behavioral Health Urgent Care Medical Screening Exam  Patient Name: Shawn Arnold MRN: [id number] Date of Evaluation: 08/29/24 Chief Complaint:   Diagnosis:  Final diagnoses:  None    History of Present illness: Shawn Arnold is a 38 y.o. male patient presented to [facility] as a walk in *** accompanied by *** with complaints of ***  Shawn Arnold, 38 y.o., male patient seen face to face by this provider, consulted with Dr. ***; and chart reviewed on 08/29/24.  On evaluation Shawn Arnold reports  During evaluation Shawn Arnold is ***(position) in no acute distress.  ***He/She is alert, oriented x 4, calm, cooperative and attentive.  ***His/Her mood is ***euthymic with congruent affect.  ***He/She has normal speech, and behavior.  Objectively there is no evidence of psychosis/mania or delusional thinking.  Patient is able to converse coherently, goal directed thoughts, no distractibility, or pre-occupation.  ***He/She also denies suicidal/self-harm/homicidal ideation, psychosis, and paranoia.  Patient answered question appropriately.     Flowsheet Row ED from 08/29/2024 in [facility] ED from 06/11/2024 in [facility] Emergency Department at [facility] ED from 04/10/2023 in [facility] Emergency Department at [facility]  C-SSRS RISK CATEGORY No Risk No Risk No Risk    Psychiatric Specialty Exam  Presentation  General Appearance:Casual  Eye Contact:Fleeting  Speech:Clear and Coherent  Speech Volume:Normal  Handedness:Right   Mood and Affect  Mood: Euthymic  Affect: Appropriate   Thought Process  Thought Processes: Coherent  Descriptions of Associations:Intact  Orientation:No data recorded Thought Content:WDL    Hallucinations:None  Ideas of Reference:None  Suicidal Thoughts:No  Homicidal Thoughts:No   Sensorium  Memory: Immediate Fair; Recent Fair; Remote Fair  Judgment: Fair  Insight: Fair   Restaurant Manager, Fast Food  Concentration: Fair  Attention Span: Fair  Recall: [employer] of Knowledge: Fair  Language: Fair   Psychomotor Activity  Psychomotor Activity: Normal   Assets  Assets: Desire for Improvement; Housing; Resilience; Communication Skills   Sleep  Sleep: Poor  Number of hours:  3   Physical Exam: Physical Exam ROS Blood pressure (!) 140/98, pulse (!) 103, temperature 98.4 F (36.9 C), temperature source Oral, resp. rate 18, SpO2 99%. There is no height or weight on file to calculate BMI.  Musculoskeletal: Strength & Muscle Tone: within normal limits Gait & Station: normal Patient leans: N/A   [facility] MSE Discharge Disposition for Follow up and Recommendations: Based on my evaluation the patient does not appear to have an emergency medical condition and can be discharged with resources and follow up care in outpatient services for Medication Management and Individual Therapy   Roxianne FORBES Olp, NP 08/29/2024, 11:57 PM

## 2024-08-29 NOTE — Progress Notes (Signed)
   08/29/24 2107  [facility] Triage Screening (Walk-ins at [facility] only)  How Did You Hear About Us ? Other (Comment)  What Is the Reason for Your Visit/Call Today? Pt presents to [facility] as a voluntary walk-in, unaccompanied requesting alcohol  detox. Pt reports that he attempted to go to [facility], but was required to complete assessment at [facility] prior to treatment at [facility]. Pt reports drinking Mike's Hard Lemonade (3) a few hours ago. Pt reports history of alcohol  use disorder and has been drinking daily for about a year. Pt is seeking a long term treatment facility. Pt currently denies SI,HI,AVH.  How Long Has This Been Causing You Problems? > than 6 months  Have You Recently Had Any Thoughts About Hurting Yourself? No  Are You Planning to Commit Suicide/Harm Yourself At This time? No  Have you Recently Had Thoughts About Hurting Someone Sherral? No  Are You Planning To Harm Someone At This Time? No  Physical Abuse Denies  Verbal Abuse Denies  Sexual Abuse Denies  Exploitation of patient/patient's resources Denies  Self-Neglect Denies  Possible abuse reported to:  (NA)  Are you currently experiencing any auditory, visual or other hallucinations? No  Have You Used Any Alcohol  or Drugs in the Past 24 Hours? Yes  What Did You Use and How Much? Mike's Hard Lemonade (3)  Do you have any current medical co-morbidities that require immediate attention? No  Clinician description of patient physical appearance/behavior: Cooperative, pleasant, oriented  What Do You Feel Would Help You the Most Today? Alcohol  or Drug Use Treatment  If access to [facility] was not available, would you have sought care in the Emergency Department? No  Determination of Need Routine (7 days)  Options For Referral Other: Comment;Chemical Dependency Intensive Outpatient Therapy (CDIOP);Facility-Based Crisis;Outpatient Therapy

## 2024-08-29 NOTE — ED Provider Notes (Signed)
 Behavioral Health Urgent Care Medical Screening Exam  Patient Name: Shawn [facility] MRN: [id number] Date of Evaluation: 08/30/24 Chief Complaint:  alcohol  detox Diagnosis:  Final diagnoses:  Alcohol  abuse    History of Present illness: Shawn Arnold is a 38 y.o. married male patient presented to [facility] as a walk in voluntarily unaccompanied with complaints of alcohol  dependence and wanting inpatient detox. Patient reports that he called [facility] requesting detox treatment and he was recommended to come [facility] for detox first.   Shawn Arnold, 38 y.o., male patient seen face to face by this provider and chart reviewed on 08/30/24.  On evaluation Shawn Arnold reports that he has been drinking since he was 38 years old. Patient reports that he drinks a lot of alcohol , mostly whiskey. Patient reports that he has last drank 1-2 hours before arriving. Patient reports that he smokes marijuana daily usually 1-2 blunts. Patient reports that the marijuana helps hi appetite because when he is drinking he does not eat. Patient denies any other illicit substances. Patient denies being connected to any outpatient medication management or outpatient therapy services. Patient denies any tremors, nausea, vomiting or headache symptoms.  Patient reports that he has had alcohol  treatment at [facility] in 2024. Patient reports that he had to have some dental work completed and he was prescribed oxycodone  which started him back to drinking. Patient endorses poor sleep only getting 2-3 hours a night.   During evaluation Shawn Arnold is sitting in the assessment in no acute distress. [name], oriented x 4, calm, cooperative but guarded. His mood is euthymic with congruent affect. He has normal speech, and behavior.  Objectively there is no evidence of psychosis/mania or delusional thinking.  Patient is able to converse coherently, goal directed thoughts, no distractibility, or pre-occupation. He also denies  suicidal/self-harm/homicidal ideation, psychosis, and paranoia.  Patient answered question appropriately.   [name] for inpatient alcohol  detox. [name] after his labs result.     Flowsheet Row ED from 08/29/2024 in [facility] ED from 06/11/2024 in [facility] ED from 04/10/2023 in [facility] Emergency Department at [facility]  C-SSRS RISK CATEGORY No Risk No Risk No Risk    Psychiatric Specialty Exam  Presentation  General Appearance:Casual  Eye Contact:Fleeting  Speech:Clear and Coherent  Speech Volume:Normal  Handedness:Right   Mood and Affect  Mood: Euthymic  Affect: Appropriate   Thought Process  Thought Processes: Coherent  Descriptions of Associations:Intact  Orientation:No data recorded Thought Content:WDL    Hallucinations:None  Ideas of Reference:None  Suicidal Thoughts:No  Homicidal Thoughts:No   Sensorium  Memory: Immediate Fair; Recent Fair; Remote Fair  Judgment: Fair  Insight: Fair   Art Therapist  Concentration: Fair  Attention Span: Fair  Recall: [employer] of Knowledge: Fair  Language: Fair   Psychomotor Activity  Psychomotor Activity: Normal   Assets  Assets: Desire for Improvement; Housing; Resilience; Communication Skills   Sleep  Sleep: Poor  Number of hours:  3   Physical Exam: Physical Exam HENT:     Head: Normocephalic.     Nose: Nose normal.  Eyes:     Pupils: Pupils are equal, round, and reactive to light.  Cardiovascular:     Rate and Rhythm: Normal rate.  Pulmonary:     Effort: Pulmonary effort is normal.  Abdominal:     General: Abdomen is flat.  Musculoskeletal:  General: Normal range of motion.     Cervical back: Normal range of motion.  Skin:    General: Skin is warm.  Neurological:     Mental Status: [name] and oriented to  person, place, and time.    Review of Systems  Constitutional: Negative.   HENT: Negative.    Eyes: Negative.   Respiratory: Negative.    Cardiovascular: Negative.   Gastrointestinal: Negative.   Genitourinary: Negative.   Musculoskeletal: Negative.   Skin: Negative.   Neurological: Negative.   Psychiatric/Behavioral:  Positive for substance abuse.    Blood pressure (!) 140/98, pulse (!) 103, temperature 98.4 F (36.9 C), temperature source Oral, resp. rate 18, SpO2 99%. There is no height or weight on file to calculate BMI.  Musculoskeletal: Strength & Muscle Tone: within normal limits Gait & Station: normal Patient leans: N/A   [facility] MSE Discharge Disposition for Follow up and Recommendations: Patient recommended for [facility] for crisis management, safety, and stabilization to safely  detox from alcohol . Patient will transfer to [facility] when his labs result.   Ousman Dise E Emmarie Sannes, NP 08/30/2024, 1:12 AM

## 2024-08-29 NOTE — Progress Notes (Signed)
 Patient is currently resting at this time with eyes closed.  No acute distress noted.  Respirations are present, even and unlabored.  No current issues noted at this time.  Will continue to monitor Q 15 min safety checks for safety/behavior.

## 2024-08-30 ENCOUNTER — Other Ambulatory Visit: Payer: Self-pay

## 2024-08-30 ENCOUNTER — Other Ambulatory Visit (HOSPITAL_COMMUNITY)
Admission: EM | Admit: 2024-08-30 | Discharge: 2024-09-01 | Disposition: A | Discharge status: Other Acute Inpatient Hospital | Payer: MEDICAID | Source: Intra-hospital | Attending: Psychiatry | Admitting: Psychiatry

## 2024-08-30 DIAGNOSIS — F1090 Alcohol use, unspecified, uncomplicated: Secondary | ICD-10-CM | POA: Diagnosis present

## 2024-08-30 DIAGNOSIS — Z5900 Homelessness unspecified: Secondary | ICD-10-CM | POA: Diagnosis not present

## 2024-08-30 DIAGNOSIS — Z560 Unemployment, unspecified: Secondary | ICD-10-CM | POA: Insufficient documentation

## 2024-08-30 DIAGNOSIS — F1010 Alcohol abuse, uncomplicated: Secondary | ICD-10-CM | POA: Diagnosis not present

## 2024-08-30 LAB — ETHANOL: Alcohol, Ethyl (B): <15 mg/dL (ref <15)

## 2024-08-30 LAB — CBC WITH DIFFERENTIAL/PLATELET
Abs Immature Granulocytes: 0.04 K/uL (ref 0.00–0.07)
Basophils Absolute: 0 K/uL (ref 0.0–0.1)
Basophils Relative: 0 %
Eosinophils Absolute: 0.2 K/uL (ref 0.0–0.5)
Eosinophils Relative: 3 %
HCT: 40.4 % (ref 39.0–52.0)
Hemoglobin: 13.3 g/dL (ref 13.0–17.0)
Immature Granulocytes: 1 %
Lymphocytes Relative: 38 %
Lymphs Abs: 2.7 K/uL (ref 0.7–4.0)
MCH: 33.7 pg (ref 26.0–34.0)
MCHC: 32.9 g/dL (ref 30.0–36.0)
MCV: 102.3 fL — ABNORMAL HIGH (ref 80.0–100.0)
Monocytes Absolute: 0.6 K/uL (ref 0.1–1.0)
Monocytes Relative: 8 %
Neutro Abs: 3.6 K/uL (ref 1.7–7.7)
Neutrophils Relative %: 50 %
Platelets: 338 K/uL (ref 150–400)
RBC: 3.95 MIL/uL — ABNORMAL LOW (ref 4.22–5.81)
RDW: 13.9 % (ref 11.5–15.5)
WBC: 7.2 K/uL (ref 4.0–10.5)
nRBC: 0 % (ref 0.0–0.2)

## 2024-08-30 LAB — COMPREHENSIVE METABOLIC PANEL WITH GFR
ALT: 14 U/L (ref 0–44)
AST: 20 U/L (ref 15–41)
Albumin: 3.6 g/dL (ref 3.5–5.0)
Alkaline Phosphatase: 48 U/L (ref 38–126)
Anion gap: 14 (ref 5–15)
BUN: 10 mg/dL (ref 6–20)
CO2: 22 mmol/L (ref 22–32)
Calcium: 9.3 mg/dL (ref 8.9–10.3)
Chloride: 101 mmol/L (ref 98–111)
Creatinine, Ser: 0.79 mg/dL (ref 0.61–1.24)
GFR, Estimated: >60 mL/min (ref >60)
Glucose, Bld: 68 mg/dL — ABNORMAL LOW (ref 70–99)
Potassium: 3.4 mmol/L — ABNORMAL LOW (ref 3.5–5.1)
Sodium: 137 mmol/L (ref 135–145)
Total Bilirubin: 0.6 mg/dL (ref 0.0–1.2)
Total Protein: 6.7 g/dL (ref 6.5–8.1)

## 2024-08-30 LAB — LIPID PANEL
Cholesterol: 132 mg/dL (ref 0–200)
HDL: 30 mg/dL — ABNORMAL LOW (ref >40)
LDL Cholesterol: 43 mg/dL (ref 0–99)
Total CHOL/HDL Ratio: 4.4 ratio
Triglycerides: 293 mg/dL — ABNORMAL HIGH (ref <150)
VLDL: 59 mg/dL — ABNORMAL HIGH (ref 0–40)

## 2024-08-30 LAB — POCT URINE DRUG SCREEN - MANUAL ENTRY (I-SCREEN)
POC Buprenorphine (BUP): NOT DETECTED
POC Cocaine UR: NOT DETECTED
POC Marijuana UR: POSITIVE — AB
POC Methadone UR: NOT DETECTED
POC Methamphetamine UR: NOT DETECTED
POC Morphine: NOT DETECTED
POC Oxazepam (BZO): NOT DETECTED
POC Oxycodone UR: NOT DETECTED
POC Secobarbital (BAR): NOT DETECTED

## 2024-08-30 MED ORDER — LORAZEPAM 2 MG/ML IJ SOLN: 2.0000 mg | Freq: Three times a day (TID) | INTRAMUSCULAR | Status: DC | PRN

## 2024-08-30 MED ORDER — ALUM & MAG HYDROXIDE-SIMETH 200-200-20 MG/5ML PO SUSP: 30.0000 mL | ORAL | Status: DC | PRN

## 2024-08-30 MED ORDER — DIPHENHYDRAMINE HCL 50 MG/ML IJ SOLN: 50.0000 mg | Freq: Three times a day (TID) | INTRAMUSCULAR | Status: DC | PRN

## 2024-08-30 MED ORDER — ADULT MULTIVITAMIN W/MINERALS CH
1.0000 | ORAL_TABLET | Freq: Every day | ORAL | Status: DC
  Administered 2024-08-30 – 2024-09-01 (×3): 1 via ORAL
  Filled 2024-08-30 (×3): qty 1
  Filled 2024-08-30: qty 7

## 2024-08-30 MED ORDER — THIAMINE MONONITRATE 100 MG PO TABS
100.0000 mg | ORAL_TABLET | Freq: Every day | ORAL | Status: DC
  Administered 2024-08-31 – 2024-09-01 (×2): 100 mg via ORAL
  Filled 2024-08-30 (×2): qty 1
  Filled 2024-08-30: qty 7

## 2024-08-30 MED ORDER — ACETAMINOPHEN 325 MG PO TABS: 650.0000 mg | ORAL_TABLET | Freq: Four times a day (QID) | ORAL | Status: DC | PRN

## 2024-08-30 MED ORDER — LORAZEPAM 1 MG PO TABS
1.0000 mg | ORAL_TABLET | Freq: Four times a day (QID) | ORAL | Status: DC
  Administered 2024-08-30: 1 mg via ORAL
  Filled 2024-08-30: qty 1

## 2024-08-30 MED ORDER — HALOPERIDOL 5 MG PO TABS: 5.0000 mg | ORAL_TABLET | Freq: Three times a day (TID) | ORAL | Status: DC | PRN

## 2024-08-30 MED ORDER — LORAZEPAM 1 MG PO TABS: 1.0000 mg | ORAL_TABLET | Freq: Two times a day (BID) | ORAL | Status: DC

## 2024-08-30 MED ORDER — THIAMINE HCL 100 MG/ML IJ SOLN: 100.0000 mg | Freq: Once | INTRAMUSCULAR | Status: DC

## 2024-08-30 MED ORDER — LORAZEPAM 1 MG PO TABS: 1.0000 mg | ORAL_TABLET | Freq: Three times a day (TID) | ORAL | Status: DC

## 2024-08-30 MED ORDER — HALOPERIDOL LACTATE 5 MG/ML IJ SOLN: 5.0000 mg | Freq: Three times a day (TID) | INTRAMUSCULAR | Status: DC | PRN

## 2024-08-30 MED ORDER — LORAZEPAM 1 MG PO TABS: 1.0000 mg | ORAL_TABLET | Freq: Every day | ORAL | Status: DC

## 2024-08-30 MED ORDER — ONDANSETRON 4 MG PO TBDP
4.0000 mg | ORAL_TABLET | Freq: Four times a day (QID) | ORAL | Status: DC | PRN
  Administered 2024-08-31: 4 mg via ORAL
  Filled 2024-08-30: qty 1

## 2024-08-30 MED ORDER — HYDROXYZINE HCL 25 MG PO TABS
25.0000 mg | ORAL_TABLET | Freq: Four times a day (QID) | ORAL | Status: DC | PRN
  Administered 2024-08-30 – 2024-09-01 (×3): 25 mg via ORAL
  Filled 2024-08-30 (×2): qty 1
  Filled 2024-08-30: qty 10
  Filled 2024-08-30: qty 1

## 2024-08-30 MED ORDER — LOPERAMIDE HCL 2 MG PO CAPS
2.0000 mg | ORAL_CAPSULE | ORAL | Status: DC | PRN
  Administered 2024-08-31: 2 mg via ORAL
  Filled 2024-08-30: qty 1

## 2024-08-30 MED ORDER — HALOPERIDOL LACTATE 5 MG/ML IJ SOLN: 10.0000 mg | Freq: Three times a day (TID) | INTRAMUSCULAR | Status: DC | PRN

## 2024-08-30 MED ORDER — DIPHENHYDRAMINE HCL 50 MG PO CAPS: 50.0000 mg | ORAL_CAPSULE | Freq: Three times a day (TID) | ORAL | Status: DC | PRN

## 2024-08-30 MED ORDER — LORAZEPAM 1 MG PO TABS: 1.0000 mg | ORAL_TABLET | Freq: Four times a day (QID) | ORAL | Status: DC | PRN

## 2024-08-30 MED ORDER — TRAZODONE HCL 50 MG PO TABS: 50.0000 mg | ORAL_TABLET | Freq: Every evening | ORAL | Status: DC | PRN

## 2024-08-30 MED ORDER — MAGNESIUM HYDROXIDE 400 MG/5ML PO SUSP: 30.0000 mL | Freq: Every day | ORAL | Status: DC | PRN

## 2024-08-30 NOTE — ED Notes (Signed)
 Patient is in the dayroom calm and composed, watching TV with other patients, and eating snacks. NAD. Respirations even and unlabored. Environment secured per policy. Will monitor for safety

## 2024-08-30 NOTE — ED Notes (Addendum)
 Pt arrived to [facility] this shift for alcohol  detox. Pt on CIWA. A&O x4. VS stable, no reports of pain. Denies SI/HI/AVH at this time. Pt contracts for safety. Skin check performed with Cloyd HERO, RN, unremarkable. Pt given prn atarax and water. Q15 min safety checks continued. No behavior/safety issues to report.

## 2024-08-30 NOTE — ED Provider Notes (Incomplete)
 Behavioral Health Progress Note  Date and Time: 08/30/2024 11:08 AM Name: Shawn [facility] MRN:  [id number]  Subjective:  Pt reports drinking ETOH for about 25 years. Pt reports drinking, daily, 1/2 or half of 1/2 of liquor, with 18 cans f [employer] Hard Lemonade. Pt also reports using THC and smoke 1 to 2 blunts per day. Pt reports no other substances are used and reports no history of seizure activity. Pt reports that he does not have any ppior mental health history, no inpatient treatment and nor prescribed  Diagnosis:  MDD, recurrent, mild GAD  Total Time spent with patient: {Time; 15 min - 8 hours:17441}  Past Psychiatric History: *** Past Medical History: *** Family History: *** Family Psychiatric  History: *** Social History: ***  Additional Social History:                         Sleep: {BHH GOOD/FAIR/POOR:22877}  Appetite:  {BHH GOOD/FAIR/POOR:22877}  Current Medications:  Current Facility-Administered Medications  Medication Dose Route Frequency Provider Last Rate Last Admin  . acetaminophen  (TYLENOL ) tablet 650 mg  650 mg Oral Q6H PRN Bobbitt, Shalon E, NP      . alum & mag hydroxide-simeth (MAALOX/MYLANTA) 200-200-20 MG/5ML suspension 30 mL  30 mL Oral Q4H PRN Bobbitt, Shalon E, NP      . haloperidol (HALDOL) tablet 5 mg  5 mg Oral TID PRN Bobbitt, Shalon E, NP       And  . diphenhydrAMINE (BENADRYL) capsule 50 mg  50 mg Oral TID PRN Bobbitt, Shalon E, NP      . haloperidol lactate (HALDOL) injection 5 mg  5 mg Intramuscular TID PRN Bobbitt, Shalon E, NP       And  . diphenhydrAMINE (BENADRYL) injection 50 mg  50 mg Intramuscular TID PRN Bobbitt, Shalon E, NP       And  . LORazepam (ATIVAN) injection 2 mg  2 mg Intramuscular TID PRN Bobbitt, Shalon E, NP      . haloperidol lactate (HALDOL) injection 10 mg  10 mg Intramuscular TID PRN Bobbitt, Shalon E, NP       And  . diphenhydrAMINE (BENADRYL) injection 50 mg  50 mg Intramuscular TID PRN Bobbitt, Shalon E, NP        And  . LORazepam (ATIVAN) injection 2 mg  2 mg Intramuscular TID PRN Bobbitt, Shalon E, NP      . hydrOXYzine (ATARAX) tablet 25 mg  25 mg Oral Q6H PRN Bobbitt, Shalon E, NP   25 mg at 08/30/24 0526  . loperamide (IMODIUM) capsule 2-4 mg  2-4 mg Oral PRN Bobbitt, Shalon E, NP      . LORazepam (ATIVAN) tablet 1 mg  1 mg Oral Q6H PRN Bobbitt, Shalon E, NP      . LORazepam (ATIVAN) tablet 1 mg  1 mg Oral QID Bobbitt, Shalon E, NP   1 mg at 08/30/24 0920   Followed by  . [START ON 08/31/2024] LORazepam (ATIVAN) tablet 1 mg  1 mg Oral TID Bobbitt, Shalon E, NP       Followed by  . [START ON 09/01/2024] LORazepam (ATIVAN) tablet 1 mg  1 mg Oral BID Bobbitt, Shalon E, NP       Followed by  . [START ON 09/03/2024] LORazepam (ATIVAN) tablet 1 mg  1 mg Oral Daily Bobbitt, Shalon E, NP      . magnesium hydroxide (MILK OF MAGNESIA) suspension 30 mL  30 mL Oral Daily  PRN Bobbitt, Shalon E, NP      . multivitamin with minerals tablet 1 tablet  1 tablet Oral Daily Bobbitt, Shalon E, NP   1 tablet at 08/30/24 0920  . ondansetron  (ZOFRAN -ODT) disintegrating tablet 4 mg  4 mg Oral Q6H PRN Bobbitt, Shalon E, NP      . thiamine (VITAMIN B1) injection 100 mg  100 mg Intramuscular Once Bobbitt, Shalon E, NP      . [START ON 08/31/2024] thiamine (VITAMIN B1) tablet 100 mg  100 mg Oral Daily Bobbitt, Shalon E, NP      . traZODone (DESYREL) tablet 50 mg  50 mg Oral QHS PRN Bobbitt, Shalon E, NP       No current outpatient medications on file.    Labs  Lab Results:  Admission on 08/29/2024, Discharged on 08/30/2024  Component Date Value Ref Range Status  . Sodium 08/29/2024 137  135 - 145 mmol/L Final  . Potassium 08/29/2024 3.4 (L)  3.5 - 5.1 mmol/L Final  . Chloride 08/29/2024 101  98 - 111 mmol/L Final  . CO2 08/29/2024 22  22 - 32 mmol/L Final  . Glucose, Bld 08/29/2024 68 (L)  70 - 99 mg/dL Final   Glucose reference range applies only to samples taken after fasting for at least 8 hours.  . BUN 08/29/2024  10  6 - 20 mg/dL Final  . Creatinine, Ser 08/29/2024 0.79  0.61 - 1.24 mg/dL Final  . Calcium 88/97/7974 9.3  8.9 - 10.3 mg/dL Final  . Total Protein 08/29/2024 6.7  6.5 - 8.1 g/dL Final  . Albumin 88/97/7974 3.6  3.5 - 5.0 g/dL Final  . AST 88/97/7974 20  15 - 41 U/L Final  . ALT 08/29/2024 14  0 - 44 U/L Final  . Alkaline Phosphatase 08/29/2024 48  38 - 126 U/L Final  . Total Bilirubin 08/29/2024 0.6  0.0 - 1.2 mg/dL Final  . GFR, Estimated 08/29/2024 >60  >60 mL/min Final   Comment: (NOTE) Calculated using the CKD-EPI Creatinine Equation (2021)   . Anion gap 08/29/2024 14  5 - 15 Final   Performed at [facility], [address] [place], [place] [address]  . WBC 08/29/2024 7.2  4.0 - 10.5 K/uL Final  . RBC 08/29/2024 3.95 (L)  4.22 - 5.81 MIL/uL Final  . Hemoglobin 08/29/2024 13.3  13.0 - 17.0 g/dL Final  . HCT 88/97/7974 40.4  39.0 - 52.0 % Final  . MCV 08/29/2024 102.3 (H)  80.0 - 100.0 fL Final  . MCH 08/29/2024 33.7  26.0 - 34.0 pg Final  . MCHC 08/29/2024 32.9  30.0 - 36.0 g/dL Final  . RDW 88/97/7974 13.9  11.5 - 15.5 % Final  . Platelets 08/29/2024 338  150 - 400 K/uL Final  . nRBC 08/29/2024 0.0  0.0 - 0.2 % Final  . Neutrophils Relative % 08/29/2024 50  % Final  . Neutro Abs 08/29/2024 3.6  1.7 - 7.7 K/uL Final  . Lymphocytes Relative 08/29/2024 38  % Final  . Lymphs Abs 08/29/2024 2.7  0.7 - 4.0 K/uL Final  . Monocytes Relative 08/29/2024 8  % Final  . Monocytes Absolute 08/29/2024 0.6  0.1 - 1.0 K/uL Final  . Eosinophils Relative 08/29/2024 3  % Final  . Eosinophils Absolute 08/29/2024 0.2  0.0 - 0.5 K/uL Final  . Basophils Relative 08/29/2024 0  % Final  . Basophils Absolute 08/29/2024 0.0  0.0 - 0.1 K/uL Final  . Immature Granulocytes 08/29/2024  1  % Final  . Abs Immature Granulocytes 08/29/2024 0.04  0.00 - 0.07 K/uL Final   Performed at [facility], [address], [place], [place] [address]  . Cholesterol 08/29/2024 132  0 - 200 mg/dL Final  .  Triglycerides 08/29/2024 293 (H)  <150 mg/dL Final  . HDL 88/97/7974 30 (L)  >40 mg/dL Final  . Total CHOL/HDL Ratio 08/29/2024 4.4  RATIO Final  . VLDL 08/29/2024 59 (H)  0 - 40 mg/dL Final  . LDL Cholesterol 08/29/2024 43  0 - 99 mg/dL Final   Comment:        Total Cholesterol/HDL:CHD Risk Coronary Heart Disease Risk Table                     Men   Women  1/2 Average Risk   3.4   3.3  Average Risk       5.0   4.4  2 X Average Risk   9.6   7.1  3 X Average Risk  23.4   11.0        Use the calculated Patient Ratio above and the CHD Risk Table to determine the patient's CHD Risk.        ATP III CLASSIFICATION (LDL):  <100     mg/dL   Optimal  899-870  mg/dL   Near or Above                    Optimal  130-159  mg/dL   Borderline  839-810  mg/dL   High  >809     mg/dL   Very High Performed at [facility], [address], [place], [place] [address]   . Alcohol , Ethyl (B) 08/29/2024 <15  <15 mg/dL Final   Comment: (NOTE) For medical purposes only. Performed at [facility], [address] [place], [place] [address]   . POC Secobarbital (BAR) 08/30/2024 None Detected  NONE DETECTED (Cut Off Level 300 ng/mL) Final  . POC Buprenorphine (BUP) 08/30/2024 None Detected  NONE DETECTED (Cut Off Level 10 ng/mL) Final  . POC Oxazepam (BZO) 08/30/2024 None Detected  NONE DETECTED (Cut Off Level 300 ng/mL) Final  . POC Cocaine UR 08/30/2024 None Detected  NONE DETECTED (Cut Off Level 300 ng/mL) Final  . POC Methamphetamine UR 08/30/2024 None Detected  NONE DETECTED (Cut Off Level 1000 ng/mL) Final  . POC Morphine  08/30/2024 None Detected  NONE DETECTED (Cut Off Level 300 ng/mL) Final  . POC Methadone UR 08/30/2024 None Detected  NONE DETECTED (Cut Off Level 300 ng/mL) Final  . POC Oxycodone  UR 08/30/2024 None Detected  NONE DETECTED (Cut Off Level 100 ng/mL) Final  . POC Marijuana UR 08/30/2024 Positive (A)  NONE DETECTED (Cut Off Level 50 ng/mL) Final  Admission on 06/11/2024,  Discharged on 06/11/2024  Component Date Value Ref Range Status  . Lipase 06/11/2024 36  11 - 51 U/L Final   Performed at [facility], [address] [place], [place] [address]  . Sodium 06/11/2024 141  135 - 145 mmol/L Final  . Potassium 06/11/2024 4.1  3.5 - 5.1 mmol/L Final  . Chloride 06/11/2024 104  98 - 111 mmol/L Final  . CO2 06/11/2024 25  22 - 32 mmol/L Final  . Glucose, Bld 06/11/2024 99  70 - 99 mg/dL Final   Glucose reference range applies only to samples taken after fasting for at least 8 hours.  SABRA BUN  06/11/2024 6  6 - 20 mg/dL Final  . Creatinine, Ser 06/11/2024 0.98  0.61 - 1.24 mg/dL Final  . Calcium 91/84/7974 9.1  8.9 - 10.3 mg/dL Final  . Total Protein 06/11/2024 7.1  6.5 - 8.1 g/dL Final  . Albumin 91/84/7974 4.3  3.5 - 5.0 g/dL Final  . AST 91/84/7974 19  15 - 41 U/L Final  . ALT 06/11/2024 6  0 - 44 U/L Final  . Alkaline Phosphatase 06/11/2024 55  38 - 126 U/L Final  . Total Bilirubin 06/11/2024 0.9  0.0 - 1.2 mg/dL Final  . GFR, Estimated 06/11/2024 >60  >60 mL/min Final   Comment: (NOTE) Calculated using the CKD-EPI Creatinine Equation (2021)   . Anion gap 06/11/2024 12  5 - 15 Final   Performed at [facility], [address] [place], [place] [address]  . WBC 06/11/2024 8.4  4.0 - 10.5 K/uL Final  . RBC 06/11/2024 4.58  4.22 - 5.81 MIL/uL Final  . Hemoglobin 06/11/2024 15.1  13.0 - 17.0 g/dL Final  . HCT 91/84/7974 44.5  39.0 - 52.0 % Final  . MCV 06/11/2024 97.2  80.0 - 100.0 fL Final  . MCH 06/11/2024 33.0  26.0 - 34.0 pg Final  . MCHC 06/11/2024 33.9  30.0 - 36.0 g/dL Final  . RDW 91/84/7974 12.5  11.5 - 15.5 % Final  . Platelets 06/11/2024 304  150 - 400 K/uL Final  . nRBC 06/11/2024 0.0  0.0 - 0.2 % Final   Performed at [facility], [address] [place], [place] [address]  . Color, Urine 06/11/2024 YELLOW  YELLOW Final  . APPearance 06/11/2024 CLEAR  CLEAR Final  . Specific Gravity, Urine 06/11/2024 1.015   1.005 - 1.030 Final  . pH 06/11/2024 7.5  5.0 - 8.0 Final  . Glucose, UA 06/11/2024 NEGATIVE  NEGATIVE mg/dL Final  . Hgb urine dipstick 06/11/2024 NEGATIVE  NEGATIVE Final  . Bilirubin Urine 06/11/2024 SMALL (A)  NEGATIVE Final  . Ketones, ur 06/11/2024 NEGATIVE  NEGATIVE mg/dL Final  . Protein, ur 91/84/7974 30 (A)  NEGATIVE mg/dL Final  . Nitrite 91/84/7974 NEGATIVE  NEGATIVE Final  . Ave [place] 06/11/2024 NEGATIVE  NEGATIVE Final   Performed at [facility], [address] [place] [address] [place], [place] [address]  . RBC / HPF 06/11/2024 NONE SEEN  0 - 5 RBC/hpf Final  . WBC, UA 06/11/2024 0-5  0 - 5 WBC/hpf Final  . Bacteria, UA 06/11/2024 RARE (A)  NONE SEEN Final  . Squamous Epithelial / HPF 06/11/2024 0-5  0 - 5 /HPF Final  . Mucus 06/11/2024 PRESENT   Final  . Hyaline Casts, UA 06/11/2024 PRESENT   Final   Performed at [facility], [address] [place], [place] [address]  . SARS Coronavirus 2 by RT PCR 06/11/2024 NEGATIVE  NEGATIVE Final   Comment: (NOTE) SARS-CoV-2 target nucleic acids are NOT DETECTED.  The SARS-CoV-2 RNA is generally detectable in upper respiratory specimens during the acute phase of infection. The lowest concentration of SARS-CoV-2 viral copies this assay can detect is 138 copies/mL. A negative result does not preclude SARS-Cov-2 infection and should not be used as the sole basis for treatment or other patient management decisions. A negative result may occur with  improper specimen collection/handling, submission of specimen other than nasopharyngeal swab, presence of viral mutation(s) within the areas targeted by this assay, and inadequate number of viral copies(<138 copies/mL). A negative result  must be combined with clinical observations, patient history, and epidemiological information. The expected result is Negative.  Fact Sheet for Patients:  bloggercourse.com  Fact Sheet for Healthcare Providers:   seriousbroker.it  This test is no                          t yet approved or cleared by the [place]  FDA and  has been authorized for detection and/or diagnosis of SARS-CoV-2 by FDA under an Emergency Use Authorization (EUA). This EUA will remain  in effect (meaning this test can be used) for the duration of the COVID-19 declaration under Section 564(b)(1) of the Act, 21 U.S.C.section 360bbb-3(b)(1), unless the authorization is terminated  or revoked sooner.      . Influenza A by PCR 06/11/2024 NEGATIVE  NEGATIVE Final  . Influenza B by PCR 06/11/2024 NEGATIVE  NEGATIVE Final   Comment: (NOTE) The Xpert Xpress SARS-CoV-2/FLU/RSV plus assay is intended as an aid in the diagnosis of influenza from Nasopharyngeal swab specimens and should not be used as a sole basis for treatment. Nasal washings and aspirates are unacceptable for Xpert Xpress SARS-CoV-2/FLU/RSV testing.  Fact Sheet for Patients: bloggercourse.com  Fact Sheet for Healthcare Providers: seriousbroker.it  This test is not yet approved or cleared by the [place]  FDA and has been authorized for detection and/or diagnosis of SARS-CoV-2 by FDA under an Emergency Use Authorization (EUA). This EUA will remain in effect (meaning this test can be used) for the duration of the COVID-19 declaration under Section 564(b)(1) of the Act, 21 U.S.C. section 360bbb-3(b)(1), unless the authorization is terminated or revoked.    SABRA Resp Syncytial Virus by PCR 06/11/2024 NEGATIVE  NEGATIVE Final   Comment: (NOTE) Fact Sheet for Patients: bloggercourse.com  Fact Sheet for Healthcare Providers: seriousbroker.it  This test is not yet approved or cleared by the [place]  FDA and has been authorized for detection and/or diagnosis of SARS-CoV-2 by FDA under an Emergency Use Authorization (EUA). This  EUA will remain in effect (meaning this test can be used) for the duration of the COVID-19 declaration under Section 564(b)(1) of the Act, 21 U.S.C. section 360bbb-3(b)(1), unless the authorization is terminated or revoked.  Performed at [facility], [address] [place], [place] [address]     Blood Alcohol  level:  Lab Results  Component Value Date   [facility] <15 08/29/2024    Metabolic Disorder Labs: Lab Results  Component Value Date   HGBA1C 4.8 03/26/2018   No results found for: PROLACTIN Lab Results  Component Value Date   CHOL 132 08/29/2024   TRIG 293 (H) 08/29/2024   HDL 30 (L) 08/29/2024   CHOLHDL 4.4 08/29/2024   VLDL 59 (H) 08/29/2024   LDLCALC 43 08/29/2024   LDLCALC 117 (H) 08/21/2022    Therapeutic Lab Levels: No results found for: LITHIUM No results found for: VALPROATE No results found for: CBMZ  Physical Findings   PHQ2-9    Flowsheet Row [facility] Visit [facility]21/20[facility]  PHQ-2 Total Score 3  PHQ-9 Total Score 10   Flowsheet Row ED from 08/30/2024 in [facility] ED from 08/29/2024 in [facility] ED from 06/11/2024 in [facility] Emergency Department at [facility]  C-SSRS RISK CATEGORY No Risk No Risk No Risk     Musculoskeletal  Strength & Muscle Tone: {desc; muscle tone:32375} Gait & Station: {PE GAIT ED WJUO:77474}  Patient leans: {Patient Leans:21022755}  Psychiatric Specialty Exam  Presentation  General Appearance:  Casual  Eye Contact: Fleeting  Speech: Clear and Coherent  Speech Volume: Normal  Handedness: Right   Mood and Affect  Mood: Euthymic  Affect: Appropriate   Thought Process  Thought Processes: Coherent  Descriptions of Associations:Intact  Orientation:No data recorded Thought Content:WDL  Diagnosis of Schizophrenia or Schizoaffective disorder in past: No     Hallucinations:Hallucinations: None  Ideas of Reference:None  Suicidal Thoughts:Suicidal Thoughts: No  Homicidal Thoughts:Homicidal Thoughts: No   Sensorium  Memory: Immediate Fair; Recent Fair; Remote Fair  Judgment: Fair  Insight: Fair   Art Therapist  Concentration: Fair  Attention Span: Fair  Recall: [employer] of Knowledge: Fair  Language: Fair   Psychomotor Activity  Psychomotor Activity: Psychomotor Activity: Normal   Assets  Assets: Desire for Improvement; Housing; Resilience; Communication Skills   Sleep  Sleep: Sleep: Poor  Estimated Sleeping Duration (Last 24 Hours): 0.00 hours (Due to Daylight Saving Time, the duration displayed may not accurately represent documentation during the time change interval)  No data recorded  Physical Exam  Physical Exam ROS Blood pressure 104/70, pulse 79, temperature 97.9 F (36.6 C), temperature source Oral, resp. rate 16, SpO2 100%. There is no height or weight on file to calculate BMI.  Treatment Plan Summary: {CHL [facility] MD [place]. EOJW:[id number]}  Curtistine Clause, RN 08/30/2024 11:08 AM

## 2024-08-30 NOTE — ED Provider Notes (Incomplete)
 Behavioral Health Progress Note  Date and Time: 08/30/2024 12:36 PM Name: Shawn Arnold MRN:  [id number]  Subjective:  Pt reports drinking ETOH for about 25 years. Pt reports drinking, daily, 1/2 or half of 1/2 of liquor, with 18 cans of [employer] Hard Lemonade. Pt also reports using THC and smoke 1 to 2 blunts per day. Pt reports no other substances are used and reports no history of seizure activity. Pt reports his last use of EOTH was a few hours before he presented to [facility]. Pt reports that he does not have any prior mental health history, no inpatient treatment and no prescribed psychotropic medications.  Pt currently denies SI/HI (plan and intent). Pt denies AVH, paranoia and psychosis. Pt reports that his has been poor because he is barely sleeping. He reports that his appetite has also been poor because he is barely eating. He also states that he used to like walking and running but no longer has interest in those things anymore. Pt reports that he worries a lot, is fatigued, restless, and has trouble concentrating. Pt reports that he drinks to help himself open up, and talk more to people, because when I was a kid, we weren't allowed to say much. So, I just held everything in and it's hard to express myself and talk to people until I drink. Pt reports he wants substance abuse treatment at [facility].  Pt reports that he is from [place], [place] and moved to [place], [place] 4 years ago. Pt reports he has siblings and his family members live all over. Pt states he has 2 children, ages 83 and 29. Pt reports no family mental health history. Pt is currently unemployed, I can't even remember when the last time I worked, and he is currently homeless.   Pt reports he is experiencing withdrawal symptoms of tremors, sweating, diarrhea, and stomach cramps. His last bowel movement was yesterday, with 4 loose stools.    Diagnosis:  F10.20 Alcohol  Use Disorder F33.1 MDD, recurrent, moderate F41.1  GAD  Total Time spent with patient: 45 minutes  Past Psychiatric History: None Past Medical History: None reported Family History: Lived in [place], [place] with siblings, moved to [place], [place] 4 years ago, siblings live all over. Family Psychiatric  History: None reported Social History: Pt is currently unemployed and homeless. He has 2 children, ages 14 and 46.   Sleep: Poor  Appetite:  Poor  Current Medications:  Current Facility-Administered Medications  Medication Dose Route Frequency Provider Last Rate Last Admin   acetaminophen  (TYLENOL ) tablet 650 mg  650 mg Oral Q6H PRN Bobbitt, Shalon E, NP       alum & mag hydroxide-simeth (MAALOX/MYLANTA) 200-200-20 MG/5ML suspension 30 mL  30 mL Oral Q4H PRN Bobbitt, Shalon E, NP       haloperidol (HALDOL) tablet 5 mg  5 mg Oral TID PRN Bobbitt, Shalon E, NP       And   diphenhydrAMINE (BENADRYL) capsule 50 mg  50 mg Oral TID PRN Bobbitt, Shalon E, NP       haloperidol lactate (HALDOL) injection 5 mg  5 mg Intramuscular TID PRN Bobbitt, Shalon E, NP       And   diphenhydrAMINE (BENADRYL) injection 50 mg  50 mg Intramuscular TID PRN Bobbitt, Shalon E, NP       And   LORazepam (ATIVAN) injection 2 mg  2 mg Intramuscular TID PRN Bobbitt, Shalon E, NP       haloperidol lactate (HALDOL) injection 10 mg  10 mg Intramuscular TID PRN Bobbitt, Shalon E, NP       And   diphenhydrAMINE (BENADRYL) injection 50 mg  50 mg Intramuscular TID PRN Bobbitt, Shalon E, NP       And   LORazepam (ATIVAN) injection 2 mg  2 mg Intramuscular TID PRN Bobbitt, Shalon E, NP       hydrOXYzine (ATARAX) tablet 25 mg  25 mg Oral Q6H PRN Bobbitt, Shalon E, NP   25 mg at 08/30/24 0526   loperamide (IMODIUM) capsule 2-4 mg  2-4 mg Oral PRN Bobbitt, Shalon E, NP       LORazepam (ATIVAN) tablet 1 mg  1 mg Oral Q6H PRN Bobbitt, Shalon E, NP       LORazepam (ATIVAN) tablet 1 mg  1 mg Oral QID Bobbitt, Shalon E, NP   1 mg at 08/30/24 0920   Followed by   NOREEN ON  08/31/2024] LORazepam (ATIVAN) tablet 1 mg  1 mg Oral TID Bobbitt, Shalon E, NP       Followed by   NOREEN ON 09/01/2024] LORazepam (ATIVAN) tablet 1 mg  1 mg Oral BID Bobbitt, Shalon E, NP       Followed by   NOREEN ON 09/03/2024] LORazepam (ATIVAN) tablet 1 mg  1 mg Oral Daily Bobbitt, Shalon E, NP       magnesium hydroxide (MILK OF MAGNESIA) suspension 30 mL  30 mL Oral Daily PRN Bobbitt, Shalon E, NP       multivitamin with minerals tablet 1 tablet  1 tablet Oral Daily Bobbitt, Shalon E, NP   1 tablet at 08/30/24 0920   ondansetron  (ZOFRAN -ODT) disintegrating tablet 4 mg  4 mg Oral Q6H PRN Bobbitt, Shalon E, NP       thiamine (VITAMIN B1) injection 100 mg  100 mg Intramuscular Once Bobbitt, Shalon E, NP       [START ON 08/31/2024] thiamine (VITAMIN B1) tablet 100 mg  100 mg Oral Daily Bobbitt, Shalon E, NP       traZODone (DESYREL) tablet 50 mg  50 mg Oral QHS PRN Bobbitt, Shalon E, NP       No current outpatient medications on file.    Labs  Lab Results:  Admission on 08/29/2024, Discharged on 08/30/2024  Component Date Value Ref Range Status   Sodium 08/29/2024 137  135 - 145 mmol/L Final   Potassium 08/29/2024 3.4 (L)  3.5 - 5.1 mmol/L Final   Chloride 08/29/2024 101  98 - 111 mmol/L Final   CO2 08/29/2024 22  22 - 32 mmol/L Final   Glucose, Bld 08/29/2024 68 (L)  70 - 99 mg/dL Final   Glucose reference range applies only to samples taken after fasting for at least 8 hours.   BUN 08/29/2024 10  6 - 20 mg/dL Final   Creatinine, Ser 08/29/2024 0.79  0.61 - 1.24 mg/dL Final   Calcium 88/97/7974 9.3  8.9 - 10.3 mg/dL Final   Total Protein 88/97/7974 6.7  6.5 - 8.1 g/dL Final   Albumin 88/97/7974 3.6  3.5 - 5.0 g/dL Final   AST 88/97/7974 20  15 - 41 U/L Final   ALT 08/29/2024 14  0 - 44 U/L Final   Alkaline Phosphatase 08/29/2024 48  38 - 126 U/L Final   Total Bilirubin 08/29/2024 0.6  0.0 - 1.2 mg/dL Final   GFR, Estimated 08/29/2024 >60  >60 mL/min Final   Comment:  (NOTE) Calculated using the CKD-EPI Creatinine Equation (2021)    Anion  gap 08/29/2024 14  5 - 15 Final   Performed at [facility], [address] [place], [place] [address]   WBC 08/29/2024 7.2  4.0 - 10.5 K/uL Final   RBC 08/29/2024 3.95 (L)  4.22 - 5.81 MIL/uL Final   Hemoglobin 08/29/2024 13.3  13.0 - 17.0 g/dL Final   HCT 88/97/7974 40.4  39.0 - 52.0 % Final   MCV 08/29/2024 102.3 (H)  80.0 - 100.0 fL Final   MCH 08/29/2024 33.7  26.0 - 34.0 pg Final   MCHC 08/29/2024 32.9  30.0 - 36.0 g/dL Final   RDW 88/97/7974 13.9  11.5 - 15.5 % Final   Platelets 08/29/2024 338  150 - 400 K/uL Final   nRBC 08/29/2024 0.0  0.0 - 0.2 % Final   Neutrophils Relative % 08/29/2024 50  % Final   Neutro Abs 08/29/2024 3.6  1.7 - 7.7 K/uL Final   Lymphocytes Relative 08/29/2024 38  % Final   Lymphs Abs 08/29/2024 2.7  0.7 - 4.0 K/uL Final   Monocytes Relative 08/29/2024 8  % Final   Monocytes Absolute 08/29/2024 0.6  0.1 - 1.0 K/uL Final   Eosinophils Relative 08/29/2024 3  % Final   Eosinophils Absolute 08/29/2024 0.2  0.0 - 0.5 K/uL Final   Basophils Relative 08/29/2024 0  % Final   Basophils Absolute 08/29/2024 0.0  0.0 - 0.1 K/uL Final   Immature Granulocytes 08/29/2024 1  % Final   Abs Immature Granulocytes 08/29/2024 0.04  0.00 - 0.07 K/uL Final   Performed at [facility], [address], [place], [place] [address]   Cholesterol 08/29/2024 132  0 - 200 mg/dL Final   Triglycerides 88/97/7974 293 (H)  <150 mg/dL Final   HDL 88/97/7974 30 (L)  >40 mg/dL Final   Total CHOL/HDL Ratio 08/29/2024 4.4  RATIO Final   VLDL 08/29/2024 59 (H)  0 - 40 mg/dL Final   LDL Cholesterol 08/29/2024 43  0 - 99 mg/dL Final   Comment:        Total Cholesterol/HDL:CHD Risk Coronary Heart Disease Risk Table                     Men   Women  1/2 Average Risk   3.4   3.3  Average Risk       5.0   4.4  2 X Average Risk   9.6   7.1  3 X Average Risk  23.4   11.0        Use the calculated Patient  Ratio above and the CHD Risk Table to determine the patient's CHD Risk.        ATP III CLASSIFICATION (LDL):  <100     mg/dL   Optimal  899-870  mg/dL   Near or Above                    Optimal  130-159  mg/dL   Borderline  839-810  mg/dL   High  >809     mg/dL   Very High Performed at [facility], [address], [place], [place] [address]    Alcohol , Ethyl (B) 08/29/2024 <15  <15 mg/dL Final   Comment: (NOTE) For medical purposes only. Performed at [facility], [address], [place], [place] [address]    POC Secobarbital LAYMOND) 08/30/2024 None Detected  NONE DETECTED (Cut Off Level 300 ng/mL) Final   POC Buprenorphine (BUP) 08/30/2024 None  Detected  NONE DETECTED (Cut Off Level 10 ng/mL) Final   POC Oxazepam (BZO) 08/30/2024 None Detected  NONE DETECTED (Cut Off Level 300 ng/mL) Final   POC Cocaine UR 08/30/2024 None Detected  NONE DETECTED (Cut Off Level 300 ng/mL) Final   POC Methamphetamine UR 08/30/2024 None Detected  NONE DETECTED (Cut Off Level 1000 ng/mL) Final   POC Morphine  08/30/2024 None Detected  NONE DETECTED (Cut Off Level 300 ng/mL) Final   POC Methadone UR 08/30/2024 None Detected  NONE DETECTED (Cut Off Level 300 ng/mL) Final   POC Oxycodone  UR 08/30/2024 None Detected  NONE DETECTED (Cut Off Level 100 ng/mL) Final   POC Marijuana UR 08/30/2024 Positive (A)  NONE DETECTED (Cut Off Level 50 ng/mL) Final  Admission on 06/11/2024, Discharged on 06/11/2024  Component Date Value Ref Range Status   Lipase 06/11/2024 36  11 - 51 U/L Final   Performed at [facility], [address] [place], [place] [address]   Sodium 06/11/2024 141  135 - 145 mmol/L Final   Potassium 06/11/2024 4.1  3.5 - 5.1 mmol/L Final   Chloride 06/11/2024 104  98 - 111 mmol/L Final   CO2 06/11/2024 25  22 - 32 mmol/L Final   Glucose, Bld 06/11/2024 99  70 - 99 mg/dL Final   Glucose reference range applies only to samples taken after fasting for at least 8 hours.   BUN  06/11/2024 6  6 - 20 mg/dL Final   Creatinine, Ser 06/11/2024 0.98  0.61 - 1.24 mg/dL Final   Calcium 91/84/7974 9.1  8.9 - 10.3 mg/dL Final   Total Protein 91/84/7974 7.1  6.5 - 8.1 g/dL Final   Albumin 91/84/7974 4.3  3.5 - 5.0 g/dL Final   AST 91/84/7974 19  15 - 41 U/L Final   ALT 06/11/2024 6  0 - 44 U/L Final   Alkaline Phosphatase 06/11/2024 55  38 - 126 U/L Final   Total Bilirubin 06/11/2024 0.9  0.0 - 1.2 mg/dL Final   GFR, Estimated 06/11/2024 >60  >60 mL/min Final   Comment: (NOTE) Calculated using the CKD-EPI Creatinine Equation (2021)    Anion gap 06/11/2024 12  5 - 15 Final   Performed at [facility], [address] [place], [place] [address]   WBC 06/11/2024 8.4  4.0 - 10.5 K/uL Final   RBC 06/11/2024 4.58  4.22 - 5.81 MIL/uL Final   Hemoglobin 06/11/2024 15.1  13.0 - 17.0 g/dL Final   HCT 91/84/7974 44.5  39.0 - 52.0 % Final   MCV 06/11/2024 97.2  80.0 - 100.0 fL Final   MCH 06/11/2024 33.0  26.0 - 34.0 pg Final   MCHC 06/11/2024 33.9  30.0 - 36.0 g/dL Final   RDW 91/84/7974 12.5  11.5 - 15.5 % Final   Platelets 06/11/2024 304  150 - 400 K/uL Final   nRBC 06/11/2024 0.0  0.0 - 0.2 % Final   Performed at [facility], [address] [place], [place] [address]   Color, Urine 06/11/2024 YELLOW  YELLOW Final   APPearance 06/11/2024 CLEAR  CLEAR Final   Specific Gravity, Urine 06/11/2024 1.015  1.005 - 1.030 Final   pH 06/11/2024 7.5  5.0 - 8.0 Final   Glucose, UA 06/11/2024 NEGATIVE  NEGATIVE mg/dL Final   Hgb urine dipstick 06/11/2024 NEGATIVE  NEGATIVE Final   Bilirubin Urine 06/11/2024 SMALL (A)  NEGATIVE Final   Ketones, ur 06/11/2024 NEGATIVE  NEGATIVE mg/dL Final  Protein, ur 06/11/2024 30 (A)  NEGATIVE mg/dL Final   Nitrite 91/84/7974 NEGATIVE  NEGATIVE Final   Leukocytes,Ua 06/11/2024 NEGATIVE  NEGATIVE Final   Performed at [facility], [address] [place], [place] [address]   RBC / HPF 06/11/2024 NONE SEEN  0 - 5  RBC/hpf Final   WBC, UA 06/11/2024 0-5  0 - 5 WBC/hpf Final   Bacteria, UA 06/11/2024 RARE (A)  NONE SEEN Final   Squamous Epithelial / HPF 06/11/2024 0-5  0 - 5 /HPF Final   Mucus 06/11/2024 PRESENT   Final   Hyaline Casts, UA 06/11/2024 PRESENT   Final   Performed at [facility], [address] [place], [place] [address]   SARS Coronavirus 2 by RT PCR 06/11/2024 NEGATIVE  NEGATIVE Final   Comment: (NOTE) SARS-CoV-2 target nucleic acids are NOT DETECTED.  The SARS-CoV-2 RNA is generally detectable in upper respiratory specimens during the acute phase of infection. The lowest concentration of SARS-CoV-2 viral copies this assay can detect is 138 copies/mL. A negative result does not preclude SARS-Cov-2 infection and should not be used as the sole basis for treatment or other patient management decisions. A negative result may occur with  improper specimen collection/handling, submission of specimen other than nasopharyngeal swab, presence of viral mutation(s) within the areas targeted by this assay, and inadequate number of viral copies(<138 copies/mL). A negative result must be combined with clinical observations, patient history, and epidemiological information. The expected result is Negative.  Fact Sheet for Patients:  bloggercourse.com  Fact Sheet for Healthcare Providers:  seriousbroker.it  This test is no                          t yet approved or cleared by the [place]  FDA and  has been authorized for detection and/or diagnosis of SARS-CoV-2 by FDA under an Emergency Use Authorization (EUA). This EUA will remain  in effect (meaning this test can be used) for the duration of the COVID-19 declaration under Section 564(b)(1) of the Act, 21 U.S.C.section 360bbb-3(b)(1), unless the authorization is terminated  or revoked sooner.       Influenza A by PCR 06/11/2024 NEGATIVE  NEGATIVE Final   Influenza B by  PCR 06/11/2024 NEGATIVE  NEGATIVE Final   Comment: (NOTE) The Xpert Xpress SARS-CoV-2/FLU/RSV plus assay is intended as an aid in the diagnosis of influenza from Nasopharyngeal swab specimens and should not be used as a sole basis for treatment. Nasal washings and aspirates are unacceptable for Xpert Xpress SARS-CoV-2/FLU/RSV testing.  Fact Sheet for Patients: bloggercourse.com  Fact Sheet for Healthcare Providers: seriousbroker.it  This test is not yet approved or cleared by the [place]  FDA and has been authorized for detection and/or diagnosis of SARS-CoV-2 by FDA under an Emergency Use Authorization (EUA). This EUA will remain in effect (meaning this test can be used) for the duration of the COVID-19 declaration under Section 564(b)(1) of the Act, 21 U.S.C. section 360bbb-3(b)(1), unless the authorization is terminated or revoked.     Resp Syncytial Virus by PCR 06/11/2024 NEGATIVE  NEGATIVE Final   Comment: (NOTE) Fact Sheet for Patients: bloggercourse.com  Fact Sheet for Healthcare Providers: seriousbroker.it  This test is not yet approved or cleared by the [place]  FDA and has been authorized for detection and/or diagnosis of SARS-CoV-2 by FDA under an Emergency Use Authorization (EUA). This EUA will remain in effect (meaning this test can be used)  for the duration of the COVID-19 declaration under Section 564(b)(1) of the Act, 21 U.S.C. section 360bbb-3(b)(1), unless the authorization is terminated or revoked.  Performed at [facility], [address] [place], [place] [address]     Blood Alcohol  level:  Lab Results  Component Value Date   [facility] <15 08/29/2024    Metabolic Disorder Labs: Lab Results  Component Value Date   HGBA1C 4.8 03/26/2018   No results found for: PROLACTIN Lab Results  Component Value Date   CHOL 132 08/29/2024    TRIG 293 (H) 08/29/2024   HDL 30 (L) 08/29/2024   CHOLHDL 4.4 08/29/2024   VLDL 59 (H) 08/29/2024   LDLCALC 43 08/29/2024   LDLCALC 117 (H) 08/21/2022    Therapeutic Lab Levels: No results found for: LITHIUM No results found for: VALPROATE No results found for: CBMZ  Physical Findings   AIMS    Flowsheet Row ED from 08/30/2024 in [facility]  AIMS Total Score 0   GAD-7    Flowsheet Row ED from 08/30/2024 in [facility]  Total GAD-7 Score 4   PHQ2-9    Flowsheet Row ED from 08/30/2024 in [facility] Office Visit from [facility]21/2022 in [facility]  PHQ-2 Total Score 6 3  PHQ-9 Total Score 15 10   Flowsheet Row ED from 08/30/2024 in [facility] ED from 08/29/2024 in [facility] ED from 06/11/2024 in [facility] Emergency Department at [facility]  C-SSRS RISK CATEGORY No Risk No Risk No Risk     Musculoskeletal  Strength & Muscle Tone: within normal limits Gait & Station: normal Patient leans: N/A  Psychiatric Specialty Exam  Presentation  General Appearance:  Disheveled  Eye Contact: Minimal  Speech: Clear and Coherent  Speech Volume: Normal  Handedness: Right   Mood and Affect  Mood: Depressed; Hopeless; Irritable; Worthless  Affect: Depressed; Flat   Thought Process  Thought Processes: Coherent; Linear  Descriptions of Associations:Intact  Orientation:Full (Time, Place and Person)  Thought Content:Logical; WDL  Diagnosis of Schizophrenia or Schizoaffective disorder in past: No    Hallucinations:Hallucinations: None  Ideas of Reference:None  Suicidal Thoughts:Suicidal Thoughts: No  Homicidal Thoughts:Homicidal Thoughts: No   Sensorium  Memory: Immediate Good; Recent Fair; Remote Fair  Judgment: Fair  Insight: Fair   Art Therapist   Concentration: Fair  Attention Span: Fair  Recall: Good  Fund of Knowledge: Fair  Language: Good   Psychomotor Activity  Psychomotor Activity: Psychomotor Activity: Decreased   Assets  Assets: Communication Skills; Desire for Improvement; Other (comment) (2 children, ages 57 and 35)   Sleep  Sleep: Sleep: Poor  Estimated Sleeping Duration (Last 24 Hours): 0.00 hours (Due to Daylight Saving Time, the duration displayed may not accurately represent documentation during the time change interval)  Nutritional Assessment (For OBS and [facility] admissions only) Has the patient had a weight loss or gain of 10 pounds or more in the last 3 months?: Yes Has the patient had a decrease in food intake/or appetite?: Yes Does the patient have eating habits or behaviors that may be indicators of an eating disorder including binging or inducing vomiting?: No Has the patient been eating poorly because of a decreased appetite?: 1    Physical Exam  Physical Exam ROS Blood pressure 104/70, pulse 79, temperature 97.9 F (36.6 C), temperature source Oral, resp. rate 16, SpO2 100%. There is no height  or weight on file to calculate BMI.  Treatment Plan Summary: Daily contact with patient to assess and evaluate symptoms and progress in treatment, Medication management, and Refer to outpatient treatment and Long/Short term Substance abuse programs   Curtistine Clause, RN Student-NP 08/30/2024 12:36 PM

## 2024-08-30 NOTE — Group Note (Signed)
 Group Topic: Communication  Group Date: 08/30/2024 Start Time: 2030 End Time: 2100 Facilitators: Anice Benton LABOR, NT  Department: [facility]  Number of Participants: 6  Group Focus: art therapy, communication Treatment Modality:  Art Therapy Interventions utilized were group exercise Purpose: pt got to guess drawings   Name: Shawn Arnold Date of Birth: Apr 13, 1986  MR: [id number]    Level of Participation: Did Not Attend  Quality of Participation: N/A Interactions with others: N/A Mood/Affect: N/A Triggers (if applicable): N/A Cognition: N/A Progress: None Response: N/A Plan: patient will be encouraged to attend groups   Patients Problems:  Patient Active Problem List   Diagnosis Date Noted   Alcohol  use disorder 08/30/2024   Elevated lipids 09/17/2022   Elevated blood pressure reading 08/21/2022   Dark urine 08/21/2022   Sensation of foreign body in skin 08/21/2022   Subcutaneous nodule of abdominal wall 11/07/2021   Right ear pain 11/07/2021   Health care maintenance 03/26/2018

## 2024-08-30 NOTE — Progress Notes (Addendum)
 Patient arrived to the unit.  Patient is alert and oriented x 4 with no acute distress noted.  Patient is calm and cooperative with no reported concerns noted.  Skin assessment completed.  Patient denies SI/HI/AVH at this time.  Patient was offered food and drink, however denied at this time.  Currently, patient is awaiting lab results and will transferred to [facility] for alcohol  detox at this time.

## 2024-08-30 NOTE — Care Management (Addendum)
 [facility] Care Management...   Writer met with the patient and discussed discharge planning.  Patient requests inpatient substance abuse treatment.    Patient has been referred to [facility], and [facility] [place]  Patient reported not wanting to return to [facility]  2:10 pm  Writer received call from [facility] [place]  Patient has been accepted into their program.  Patient can discharge 09/01/2024 by 11 am   LCG will pick up Wednesday 09/01/2024 Writer requested call from driver with eta    7 day supply and scripts

## 2024-08-30 NOTE — Group Note (Signed)
 Group Topic: Recovery Basics  Group Date: 08/30/2024 Start Time: 1000 End Time: 1100 Facilitators: Elnor Keven SAILOR  Department: [facility]  Number of Participants: 8  Group Focus: coping skills Treatment Modality:  Psychoeducation Interventions utilized were support Purpose: enhance coping skills  Name: Cap Massi Date of Birth: Aug 15, 1986  MR: [id number]    Level of Participation: Pt did not participated in group. Quality of Participation: NA Interactions with others: NA Mood/Affect: NA Triggers (if applicable): NA Cognition: NA Progress: None Response: NA Plan: follow-up needed  Patients Problems:  Patient Active Problem List   Diagnosis Date Noted   Alcohol  use disorder 08/30/2024   Elevated lipids 09/17/2022   Elevated blood pressure reading 08/21/2022   Dark urine 08/21/2022   Sensation of foreign body in skin 08/21/2022   Subcutaneous nodule of abdominal wall 11/07/2021   Right ear pain 11/07/2021   Health care maintenance 03/26/2018

## 2024-08-30 NOTE — Discharge Instructions (Signed)
 [facility] Care Management...  Writer received call from [facility] [place]  Patient has been accepted into their program.  Patient can discharge 09/01/2024 by 11 am   LCG will pick up Wednesday 09/01/2024 Writer requested call from driver with eta    7 day supply and scripts

## 2024-08-30 NOTE — Progress Notes (Addendum)
 Labs have resulted in pt's chart, and patient will be admitted to [facility].  Patient is currently resting at this time with no acute distress noted.  BP taken 118/72, pulse 72. No further issues or concerns noted at this time.  [facility] registration completed and taken to registration for [facility] admission.

## 2024-08-30 NOTE — BH Assessment (Signed)
 Comprehensive Clinical Assessment (CCA) Note  08/30/2024 Shaft Corigliano [id number]  Disposition: Roxianne Olp, NP, recommends admission to [facility].   The patient demonstrates the following risk factors for suicide: Chronic risk factors for suicide include: psychiatric disorder of depression and substance use disorder. Acute risk factors for suicide include: unemployment, social withdrawal/isolation, and loss (financial, interpersonal, professional). Protective factors for this patient include: responsibility to others (children, family) and hope for the future. Considering these factors, the overall suicide risk at this point appears to be high. Patient is not appropriate for outpatient follow up.  Eyden Dobie is a 38 year old male presenting as a voluntary walk-in to [facility] requesting alcohol  detox. Patient denied SI, HI, psychosis and drug usage. Patient reports he was referred by [facility], stating they required him to complete an assessment at [facility] prior to treatment.   Patient reports drinking 12 pk of Mikes Hard Lemonade and a 5th of alcohol  daily. Patient reports he initially started drinking at the age of 38. Patient reports stressor is alcohol  problem. Patient reports worsening depressive symptoms. Patient reports 3-4 hours sleep and poor appetite.   Patient does not have a psychiatrist or therapist. Patient is not taking any psych medications. Patient reports history of detox from [facility] in 2024. Patient denied prior psych hospitalizations, suicide attempts and self-harming behaviors.   Patient resides with mother. Patient has been married for 4 months. Patient reports having 2 children (15 and 19). Patient is currently unemployed. Patient denied access to guns. Patient was calm and cooperative during assessment. Patient seeking alcohol  detox.   Chief Complaint:  Chief Complaint  Patient presents with   Alcohol  Problem   Visit Diagnosis:  Alcohol  Abuse    CCA Screening, Triage and  Referral (STR)  Patient Reported Information How did you hear about us ? Other (Comment)  What Is the Reason for Your Visit/Call Today? Pt presents to [facility] as a voluntary walk-in, unaccompanied requesting alcohol  detox. Pt reports that he attempted to go to [facility], but was required to complete assessment at [facility] prior to treatment at [facility]. Pt reports drinking Mike's Hard Lemonade (3) a few hours ago. Pt reports history of alcohol  use disorder and has been drinking daily for about a year. Pt is seeking a long term treatment facility. Pt currently denies SI,HI,AVH.  How Long Has This Been Causing You Problems? > than 6 months  What Do You Feel Would Help You the Most Today? Alcohol  or Drug Use Treatment   Have You Recently Had Any Thoughts About Hurting Yourself? No  Are You Planning to Commit Suicide/Harm Yourself At This time? No   Flowsheet Row ED from 08/29/2024 in [facility] ED from 06/11/2024 in [facility] Emergency Department at [facility] ED from 04/10/2023 in [facility] Emergency Department at [facility]  C-SSRS RISK CATEGORY No Risk No Risk No Risk    Have you Recently Had Thoughts About Hurting Someone Sherral? No  Are You Planning to Harm Someone at This Time? No  Explanation: n/a   Have You Used Any Alcohol  or Drugs in the Past 24 Hours? Yes  How Long Ago Did You Use Drugs or Alcohol ? N/a What Did You Use and How Much? Mike's Hard Lemonade (3)   Do You Currently Have a Therapist/Psychiatrist? No  Name of Therapist/Psychiatrist:  n/a  Have You Been Recently Discharged From Any Office Practice or Programs? No  Explanation of Discharge From Practice/Program: n/a    CCA Screening Triage Referral Assessment  Type of Contact: Face-to-Face  Telemedicine Service Delivery:  n/a Is this Initial or Reassessment?  N/a Date Telepsych consult ordered in [facility]:   N/a Time Telepsych consult ordered in [facility]:   N/a Location of  Assessment: [facility] Assessment Services  Provider Location: [facility] Assessment Services   Collateral Involvement: none   Does Patient Have a Automotive Engineer Guardian? No  Legal Guardian Contact Information: n/a  Copy of Legal Guardianship Form: -- (n/a)  Legal Guardian Notified of Arrival: -- (n/a)  Legal Guardian Notified of Pending Discharge: -- (n/a)  If Minor and Not Living with Parent(s), Who has Custody? n/a  Is CPS involved or ever been involved? Never  Is APS involved or ever been involved? Never   Patient Determined To Be At Risk for Harm To Self or Others Based on Review of Patient Reported Information or Presenting Complaint? No  Method: No Plan  Availability of Means: No access or NA  Intent: Vague intent or NA  Notification Required: No need or identified person  Additional Information for Danger to Others Potential: -- (n/a)  Additional Comments for Danger to Others Potential: n/a  Are There Guns or Other Weapons in Your Home? No  Types of Guns/Weapons: n/a  Are These Weapons Safely Secured?                            -- (n/a)  Who Could Verify You Are Able To Have These Secured: n/a  Do You Have any Outstanding Charges, Pending Court Dates, Parole/Probation? none reported  Contacted To Inform of Risk of Harm To Self or Others: Family/Significant Other:   Does Patient Present under Involuntary Commitment? No   [place] of Residence: [place]   Patient Currently Receiving the Following Services: Not Receiving Services   Determination of Need: Urgent (48 hours)   Options For Referral: Facility-Based Crisis; Outpatient Therapy; BH Urgent Care; Medication Management   CCA Biopsychosocial Patient Reported Schizophrenia/Schizoaffective Diagnosis in Past: No   Strengths: self-awareness   Mental Health Symptoms Depression:  Hopelessness; Fatigue   Duration of Depressive symptoms: Duration of Depressive Symptoms: Greater than two  weeks   Mania:  None   Anxiety:   None   Psychosis:  None   Duration of Psychotic symptoms:    Trauma:  None   Obsessions:  None   Compulsions:  None   Inattention:  None   Hyperactivity/Impulsivity:  None   Oppositional/Defiant Behaviors:  None   Emotional Irregularity:  None   Other Mood/Personality Symptoms:  n/a    Mental Status Exam Appearance and self-care  Stature:  Average   Weight:  Average weight   Clothing:  Age-appropriate   Grooming:  Normal   Cosmetic use:  None   Posture/gait:  Normal   Motor activity:  Not Remarkable   Sensorium  Attention:  Normal   Concentration:  Normal   Orientation:  X5   Recall/memory:  Normal   Affect and Mood  Affect:  Appropriate   Mood:  Depressed   Relating  Eye contact:  Normal   Facial expression:  Depressed   Attitude toward examiner:  Cooperative   Thought and Language  Speech flow: Normal   Thought content:  Appropriate to Mood and Circumstances   Preoccupation:  None   Hallucinations:  None   Organization:  Coherent   [employer] of Knowledge:  Average   Intelligence:  Average   Abstraction:  Normal  Judgement:  Normal   Reality Testing:  Adequate   Insight:  Lacking   Decision Making:  Normal   Social Functioning  Social Maturity:  Impulsive   Social Judgement:  Naive   Stress  Stressors:  Surveyor, Quantity; Work   Coping Ability:  Overwhelmed; Exhausted   Skill Deficits:  Decision making; Self-control   Supports:  Support needed     Religion: Religion/Spirituality Are You A Religious Person?: Yes How Might This Affect Treatment?: n/a  Leisure/Recreation: Leisure / Recreation Do You Have Hobbies?: Yes Leisure and Hobbies: sports and reading  Exercise/Diet: Exercise/Diet Do You Exercise?: No Have You Gained or Lost A Significant Amount of Weight in the Past Six Months?: No Do You Follow a Special Diet?: No Do You Have Any Trouble Sleeping?:  Yes Explanation of Sleeping Difficulties: poor, 3-4 hours   CCA Employment/Education 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 Job has Been Impacted by Current Illness: No Has Patient ever Been in Equities Trader?: No  Education: Education Is Patient Currently Attending School?: No Last Grade Completed: 11 Did You Product Manager?: No Did You Have An Individualized Education Program (IIEP): No Did You Have Any Difficulty At School?: No Patient's Education Has Been Impacted by Current Illness: No   CCA Family/Childhood History Family and Relationship History: Family history Marital status: Married Number of Years Married: 4 What types of issues is patient dealing with in the relationship?: uta Additional relationship information: n/a Does patient have children?: Yes How many children?: 2 How is patient's relationship with their children?: 15 and 19  Childhood History:  Childhood History By whom was/is the patient raised?: Mother Did patient suffer any verbal/emotional/physical/sexual abuse as a child?: No Did patient suffer from severe childhood neglect?: No Has patient ever been sexually abused/assaulted/raped as an adolescent or adult?: No Was the patient ever a victim of a crime or a disaster?: No Witnessed domestic violence?: No Has patient been affected by domestic violence as an adult?: No  CCA Substance Use Alcohol /Drug Use: Alcohol  / Drug Use Pain Medications: see MAR Prescriptions: see MAR Over the Counter: see MAR History of alcohol  / drug use?: Yes Longest period of sobriety (when/how long): n/a Negative Consequences of Use:  (n/a) Withdrawal Symptoms:  (n/a) Substance #1 Name of Substance 1: alcohol  1 - Age of First Use: 15 1 - Amount (size/oz): 5th of alcohol  and 12 pk Mikes Hard Lemonade 1 - Frequency: daily 1 - Last Use / Amount: today    ASAM's:  Six Dimensions of Multidimensional Assessment  Dimension 1:  Acute  Intoxication and/or Withdrawal Potential:   Dimension 1:  Description of individual's past and current experiences of substance use and withdrawal: Ongoing alcohol  usage  Dimension 2:  Biomedical Conditions and Complications:   Dimension 2:  Description of patient's biomedical conditions and  complications: No medical reported  Dimension 3:  Emotional, Behavioral, or Cognitive Conditions and Complications:  Dimension 3:  Description of emotional, behavioral, or cognitive conditions and complications: Worsening depressive symptoms  Dimension 4:  Readiness to Change:  Dimension 4:  Description of Readiness to Change criteria: Seeking detox  Dimension 5:  Relapse, Continued use, or Continued Problem Potential:  Dimension 5:  Relapse, continued use, or continued problem potential critiera description: Continued usage  Dimension 6:  Recovery/Living Environment:  Dimension 6:  Recovery/Iiving environment criteria description: Supportive family.  ASAM Severity Score: ASAM's Severity Rating Score: 8  ASAM Recommended Level of Treatment: ASAM Recommended Level of Treatment: Level III Residential Treatment  Substance use Disorder (SUD) Substance Use Disorder (SUD)  Checklist Symptoms of Substance Use: Evidence of tolerance, Continued use despite having a persistent/recurrent physical/psychological problem caused/exacerbated by use, Continued use despite persistent or recurrent social, interpersonal problems, caused or exacerbated by use, Presence of craving or strong urge to use  Recommendations for Services/Supports/Treatments: Recommendations for Services/Supports/Treatments Recommendations For Services/Supports/Treatments: Individual Therapy, Facility Based Crisis  Disposition Recommendation per psychiatric provider:  Recommends admission to [facility].    DSM5 Diagnoses: Patient Active Problem List   Diagnosis Date Noted   Elevated lipids 09/17/2022   Elevated blood pressure reading 08/21/2022   Dark  urine 08/21/2022   Sensation of foreign body in skin 08/21/2022   Subcutaneous nodule of abdominal wall 11/07/2021   Right ear pain 11/07/2021   Health care maintenance 03/26/2018     Referrals to Alternative Service(s): Referred to Alternative Service(s):   Place:   Date:   Time:    Referred to Alternative Service(s):   Place:   Date:   Time:    Referred to Alternative Service(s):   Place:   Date:   Time:    Referred to Alternative Service(s):   Place:   Date:   Time:     [place] JONETTA Childes, [facility]

## 2024-08-30 NOTE — Progress Notes (Signed)
 Meal given

## 2024-08-30 NOTE — ED Notes (Signed)
 Patient is asleep. Respirations even and unlabored. No acute distress observed.  Resting in bed asleep. Environment secured. Poc ongoing

## 2024-08-30 NOTE — ED Provider Notes (Signed)
 Facility Based Crisis Admission H&P  Date: 08/30/24 Patient Name: Shawn Arnold MRN: [id number] Diagnoses:  Final diagnoses:  Alcohol  use disorder   CC:I am drinking too much alcohol .  HPI: Shawn Arnold is a 38 y.o. male is a 38 yo African American male with a reported history of Alcohol  use d/o who presented to the [facility] on 11/02 with complaints of worsening alcohol  abuse and requesting detox, and referrals into a substance abuse rehabilitation program.   Assessment & Review of Psychiatry Symptoms: Pt reports drinking ETOH for about 25 years. Pt reports drinking, daily, 1/2 or half of 1/2 of liquor, with 18 cans of Mike's Hard Lemonade. Pt also reports using THC and smoke 1 to 2 blunts per day. Pt reports no other substances are used and reports no history of seizure activity. Pt reports his last use of EOTH was a few hours before he presented to [facility]. However, BAL is lower than 15. He denies any history of complications related to alcohol  withdrawals, denies any history of seizures related to alcohol  withdrawals and reports multiple blackouts in the past. Denies any other substance use. Pt reports that he does not have any prior mental health history, no inpatient treatment and no prescribed psychotropic medications.   Pt currently denies SI/HI (plan and intent) currently. Pt denies AVH, denies paranoia and psychosis. Pt reports that his has been poor because he is barely sleeping. He reports that his appetite has also been poor because he is barely eating. He also states that he used to like walking and running but no longer has interest in those things anymore. Pt reports that he worries a lot, is fatigued, restless, and has trouble concentrating. Pt reports that he drinks to help himself open up, and talk more to people, because when I was a kid, we weren't allowed to say much. So, I just held everything in and it's hard to express myself and talk to people until I drink. Pt reports he  wants substance abuse treatment at [facility].   Pt reports that he is from [place], [place] and moved to [place], [place] 4 years ago. Pt reports he has siblings and his family members live all over. Pt states he has 2 children, ages 19 and 58. Pt reports no family mental health history. Pt is currently unemployed, I can't even remember when the last time I worked, and he is currently homeless.    Pt reports he is experiencing withdrawal symptoms of tremors, sweating, diarrhea, and stomach cramps. His last bowel movement was yesterday, with 4 loose stools.   Pt with flat affect and depressed mood, attention to personal hygiene and grooming is fair, eye contact is good, speech is clear & coherent. Thought contents are organized and logical, and pt currently denies SI/HI/AVH or paranoia. There is no evidence of delusional thoughts.    PHQ 2-9:  Flowsheet Row ED from 08/30/2024 in [facility] Office Visit from 08/21/2022 in [facility]  Thoughts that you would be better off dead, or of hurting yourself in some way Not at all Not at all  PHQ-9 Total Score 15 10    Flowsheet Row ED from 08/30/2024 in [facility] ED from 08/29/2024 in [facility] ED from 06/11/2024 in [facility] Emergency Department at [facility]  C-SSRS RISK CATEGORY No Risk No Risk No Risk    Screenings    Flowsheet Row Most Recent Value  CIWA-Ar Total 1  COWS Total Score 2  AIMS Total Score 0  Total GAD-7 Score 4  (MDQ) Mood Disorder Questionnaire Total Score 0  Total Time spent with patient: 1.5 hours  Musculoskeletal  Strength & Muscle Tone: within normal limits Gait & Station: normal Patient leans: N/A  Psychiatric Specialty Exam  Presentation General Appearance:  Casual; Fairly Groomed  Eye Contact: Fair  Speech: Clear and Coherent  Speech Volume: Normal  Handedness: Right   Mood and Affect   Mood: Anxious; Depressed  Affect: Congruent   Thought Process  Thought Processes: Disorganized; Coherent  Descriptions of Associations:Intact  Orientation:Full (Time, Place and Person)  Thought Content:Logical  Diagnosis of Schizophrenia or Schizoaffective disorder in past: No   Hallucinations:Hallucinations: None  Ideas of Reference:None  Suicidal Thoughts:Suicidal Thoughts: No  Homicidal Thoughts:Homicidal Thoughts: No   Sensorium  Memory: Immediate Fair  Judgment: Fair  Insight: Fair   Art Therapist  Concentration: Fair  Attention Span: Fair  Recall: Fair  Fund of Knowledge: Fair  Language: Fair   Psychomotor Activity  Psychomotor Activity: Psychomotor Activity: Decreased   Assets  Assets: Desire for Improvement   Sleep  Sleep: Sleep: Fair Number of Hours of Sleep: 3   Nutritional Assessment (For OBS and FBC admissions only) Has the patient had a weight loss or gain of 10 pounds or more in the last 3 months?: No Has the patient had a decrease in food intake/or appetite?: No Does the patient have dental problems?: No Does the patient have eating habits or behaviors that may be indicators of an eating disorder including binging or inducing vomiting?: No Has the patient recently lost weight without trying?: 0 Has the patient been eating poorly because of a decreased appetite?: 0 Malnutrition Screening Tool Score: 0   Physical Exam Constitutional:      Appearance: Normal appearance.  HENT:     Head: Normocephalic.  Musculoskeletal:     Cervical back: Normal range of motion.  Neurological:     General: No focal deficit present.     Mental Status: [name] and oriented to person, place, and time.    Review of Systems  Psychiatric/Behavioral:  Positive for depression, memory loss and substance abuse. Negative for hallucinations and suicidal ideas. The patient is nervous/anxious and has insomnia.   All other systems  reviewed and are negative.   Blood pressure 112/74, pulse 79, temperature 97.9 F (36.6 C), temperature source Oral, resp. rate 16, SpO2 100%. There is no height or weight on file to calculate BMI.  Past Psychiatric History: alcohol  use disorder   Is the patient at risk to self? No  Has the patient been a risk to self in the past 6 months? No .    Has the patient been a risk to self within the distant past? No   Is the patient a risk to others? No   Has the patient been a risk to others in the past 6 months? No   Has the patient been a risk to others within the distant past? No   Past Medical History: denies  Family History: denies Social History: Homeless  Last Labs:  Admission on 08/29/2024, Discharged on 08/30/2024  Component Date Value Ref Range Status   Sodium 08/29/2024 137  135 - 145 mmol/L Final   Potassium 08/29/2024 3.4 (L)  3.5 - 5.1 mmol/L Final   Chloride 08/29/2024 101  98 - 111 mmol/L Final   CO2 08/29/2024 22  22 - 32 mmol/L Final   Glucose, Bld  08/29/2024 68 (L)  70 - 99 mg/dL Final   Glucose reference range applies only to samples taken after fasting for at least 8 hours.   BUN 08/29/2024 10  6 - 20 mg/dL Final   Creatinine, Ser 08/29/2024 0.79  0.61 - 1.24 mg/dL Final   Calcium 88/97/7974 9.3  8.9 - 10.3 mg/dL Final   Total Protein 88/97/7974 6.7  6.5 - 8.1 g/dL Final   Albumin 88/97/7974 3.6  3.5 - 5.0 g/dL Final   AST 88/97/7974 20  15 - 41 U/L Final   ALT 08/29/2024 14  0 - 44 U/L Final   Alkaline Phosphatase 08/29/2024 48  38 - 126 U/L Final   Total Bilirubin 08/29/2024 0.6  0.0 - 1.2 mg/dL Final   GFR, Estimated 08/29/2024 >60  >60 mL/min Final   Comment: (NOTE) Calculated using the CKD-EPI Creatinine Equation (2021)    Anion gap 08/29/2024 14  5 - 15 Final   Performed at [facility], [address] [place], [place] [address]   WBC 08/29/2024 7.2  4.0 - 10.5 K/uL Final   RBC 08/29/2024 3.95 (L)  4.22 - 5.81 MIL/uL Final   Hemoglobin  08/29/2024 13.3  13.0 - 17.0 g/dL Final   HCT 88/97/7974 40.4  39.0 - 52.0 % Final   MCV 08/29/2024 102.3 (H)  80.0 - 100.0 fL Final   MCH 08/29/2024 33.7  26.0 - 34.0 pg Final   MCHC 08/29/2024 32.9  30.0 - 36.0 g/dL Final   RDW 88/97/7974 13.9  11.5 - 15.5 % Final   Platelets 08/29/2024 338  150 - 400 K/uL Final   nRBC 08/29/2024 0.0  0.0 - 0.2 % Final   Neutrophils Relative % 08/29/2024 50  % Final   Neutro Abs 08/29/2024 3.6  1.7 - 7.7 K/uL Final   Lymphocytes Relative 08/29/2024 38  % Final   Lymphs Abs 08/29/2024 2.7  0.7 - 4.0 K/uL Final   Monocytes Relative 08/29/2024 8  % Final   Monocytes Absolute 08/29/2024 0.6  0.1 - 1.0 K/uL Final   Eosinophils Relative 08/29/2024 3  % Final   Eosinophils Absolute 08/29/2024 0.2  0.0 - 0.5 K/uL Final   Basophils Relative 08/29/2024 0  % Final   Basophils Absolute 08/29/2024 0.0  0.0 - 0.1 K/uL Final   Immature Granulocytes 08/29/2024 1  % Final   Abs Immature Granulocytes 08/29/2024 0.04  0.00 - 0.07 K/uL Final   Performed at [facility], [address] [place], [place] [address]   Cholesterol 08/29/2024 132  0 - 200 mg/dL Final   Triglycerides 88/97/7974 293 (H)  <150 mg/dL Final   HDL 88/97/7974 30 (L)  >40 mg/dL Final   Total CHOL/HDL Ratio 08/29/2024 4.4  RATIO Final   VLDL 08/29/2024 59 (H)  0 - 40 mg/dL Final   LDL Cholesterol 08/29/2024 43  0 - 99 mg/dL Final   Comment:        Total Cholesterol/HDL:CHD Risk Coronary Heart Disease Risk Table                     Men   Women  1/2 Average Risk   3.4   3.3  Average Risk       5.0   4.4  2 X Average Risk   9.6   7.1  3 X Average Risk  23.4   11.0        Use the calculated Patient Ratio above and the CHD  Risk Table to determine the patient's CHD Risk.        ATP III CLASSIFICATION (LDL):  <100     mg/dL   Optimal  899-870  mg/dL   Near or Above                    Optimal  130-159  mg/dL   Borderline  839-810  mg/dL   High  >809     mg/dL   Very High Performed at  [facility], [address] [place], [place] [address]    Alcohol , Ethyl (B) 08/29/2024 <15  <15 mg/dL Final   Comment: (NOTE) For medical purposes only. Performed at [facility], [address], [place], [place] [address]    POC Secobarbital LAYMOND) 08/30/2024 None Detected  NONE DETECTED (Cut Off Level 300 ng/mL) Final   POC Buprenorphine (BUP) 08/30/2024 None Detected  NONE DETECTED (Cut Off Level 10 ng/mL) Final   POC Oxazepam (BZO) 08/30/2024 None Detected  NONE DETECTED (Cut Off Level 300 ng/mL) Final   POC Cocaine UR 08/30/2024 None Detected  NONE DETECTED (Cut Off Level 300 ng/mL) Final   POC Methamphetamine UR 08/30/2024 None Detected  NONE DETECTED (Cut Off Level 1000 ng/mL) Final   POC Morphine  08/30/2024 None Detected  NONE DETECTED (Cut Off Level 300 ng/mL) Final   POC Methadone UR 08/30/2024 None Detected  NONE DETECTED (Cut Off Level 300 ng/mL) Final   POC Oxycodone  UR 08/30/2024 None Detected  NONE DETECTED (Cut Off Level 100 ng/mL) Final   POC Marijuana UR 08/30/2024 Positive (A)  NONE DETECTED (Cut Off Level 50 ng/mL) Final  Admission on 06/11/2024, Discharged on 06/11/2024  Component Date Value Ref Range Status   Lipase 06/11/2024 36  11 - 51 U/L Final   Performed at [facility], [address] [place], [place] [address]   Sodium 06/11/2024 141  135 - 145 mmol/L Final   Potassium 06/11/2024 4.1  3.5 - 5.1 mmol/L Final   Chloride 06/11/2024 104  98 - 111 mmol/L Final   CO2 06/11/2024 25  22 - 32 mmol/L Final   Glucose, Bld 06/11/2024 99  70 - 99 mg/dL Final   Glucose reference range applies only to samples taken after fasting for at least 8 hours.   BUN 06/11/2024 6  6 - 20 mg/dL Final   Creatinine, Ser 06/11/2024 0.98  0.61 - 1.24 mg/dL Final   Calcium 91/84/7974 9.1  8.9 - 10.3 mg/dL Final   Total Protein 91/84/7974 7.1  6.5 - 8.1 g/dL Final   Albumin 91/84/7974 4.3  3.5 - 5.0 g/dL Final   AST 91/84/7974 19  15 - 41 U/L Final   ALT  06/11/2024 6  0 - 44 U/L Final   Alkaline Phosphatase 06/11/2024 55  38 - 126 U/L Final   Total Bilirubin 06/11/2024 0.9  0.0 - 1.2 mg/dL Final   GFR, Estimated 06/11/2024 >60  >60 mL/min Final   Comment: (NOTE) Calculated using the CKD-EPI Creatinine Equation (2021)    Anion gap 06/11/2024 12  5 - 15 Final   Performed at [facility], [address] [place], [place] [address]   WBC 06/11/2024 8.4  4.0 - 10.5 K/uL Final   RBC 06/11/2024 4.58  4.22 - 5.81 MIL/uL Final   Hemoglobin 06/11/2024 15.1  13.0 - 17.0 g/dL Final   HCT 91/84/7974 44.5  39.0 - 52.0 % Final   MCV 06/11/2024 97.2  80.0 - 100.0 fL Final   MCH 06/11/2024 33.0  26.0 - 34.0 pg Final   MCHC 06/11/2024 33.9  30.0 - 36.0 g/dL Final   RDW 91/84/7974 12.5  11.5 - 15.5 % Final   Platelets 06/11/2024 304  150 - 400 K/uL Final   nRBC 06/11/2024 0.0  0.0 - 0.2 % Final   Performed at [facility], [address] [place], [place] [address]   Color, Urine 06/11/2024 YELLOW  YELLOW Final   APPearance 06/11/2024 CLEAR  CLEAR Final   Specific Gravity, Urine 06/11/2024 1.015  1.005 - 1.030 Final   pH 06/11/2024 7.5  5.0 - 8.0 Final   Glucose, UA 06/11/2024 NEGATIVE  NEGATIVE mg/dL Final   Hgb urine dipstick 06/11/2024 NEGATIVE  NEGATIVE Final   Bilirubin Urine 06/11/2024 SMALL (A)  NEGATIVE Final   Ketones, ur 06/11/2024 NEGATIVE  NEGATIVE mg/dL Final   Protein, ur 91/84/7974 30 (A)  NEGATIVE mg/dL Final   Nitrite 91/84/7974 NEGATIVE  NEGATIVE Final   Leukocytes,Ua 06/11/2024 NEGATIVE  NEGATIVE Final   Performed at [facility], [address] [place], [place] [address]   RBC / HPF 06/11/2024 NONE SEEN  0 - 5 RBC/hpf Final   WBC, UA 06/11/2024 0-5  0 - 5 WBC/hpf Final   Bacteria, UA 06/11/2024 RARE (A)  NONE SEEN Final   Squamous Epithelial / HPF 06/11/2024 0-5  0 - 5 /HPF Final   Mucus 06/11/2024 PRESENT   Final   Hyaline Casts, UA 06/11/2024 PRESENT   Final   Performed at [facility], [address] [place], [place] [address]   SARS Coronavirus 2 by RT PCR 06/11/2024 NEGATIVE  NEGATIVE Final   Comment: (NOTE) SARS-CoV-2 target nucleic acids are NOT DETECTED.  The SARS-CoV-2 RNA is generally detectable in upper respiratory specimens during the acute phase of infection. The lowest concentration of SARS-CoV-2 viral copies this assay can detect is 138 copies/mL. A negative result does not preclude SARS-Cov-2 infection and should not be used as the sole basis for treatment or other patient management decisions. A negative result may occur with  improper specimen collection/handling, submission of specimen other than nasopharyngeal swab, presence of viral mutation(s) within the areas targeted by this assay, and inadequate number of viral copies(<138 copies/mL). A negative result must be combined with clinical observations, patient history, and epidemiological information. The expected result is Negative.  Fact Sheet for Patients:  bloggercourse.com  Fact Sheet for Healthcare Providers:  seriousbroker.it  This test is no                          t yet approved or cleared by the [place]  FDA and  has been authorized for detection and/or diagnosis of SARS-CoV-2 by FDA under an Emergency Use Authorization (EUA). This EUA will remain  in effect (meaning this test can be used) for the duration of the COVID-19 declaration under Section 564(b)(1) of the Act, 21 U.S.C.section 360bbb-3(b)(1), unless the authorization is terminated  or revoked sooner.       Influenza A by PCR 06/11/2024 NEGATIVE  NEGATIVE Final   Influenza B by PCR 06/11/2024 NEGATIVE  NEGATIVE Final   Comment: (NOTE) The Xpert Xpress SARS-CoV-2/FLU/RSV plus assay is intended as an aid in the diagnosis of influenza from Nasopharyngeal swab specimens and should not be used as a sole basis for treatment. Nasal washings and aspirates are  unacceptable for Xpert Xpress  SARS-CoV-2/FLU/RSV testing.  Fact Sheet for Patients: bloggercourse.com  Fact Sheet for Healthcare Providers: seriousbroker.it  This test is not yet approved or cleared by the [place]  FDA and has been authorized for detection and/or diagnosis of SARS-CoV-2 by FDA under an Emergency Use Authorization (EUA). This EUA will remain in effect (meaning this test can be used) for the duration of the COVID-19 declaration under Section 564(b)(1) of the Act, 21 U.S.C. section 360bbb-3(b)(1), unless the authorization is terminated or revoked.     Resp Syncytial Virus by PCR 06/11/2024 NEGATIVE  NEGATIVE Final   Comment: (NOTE) Fact Sheet for Patients: bloggercourse.com  Fact Sheet for Healthcare Providers: seriousbroker.it  This test is not yet approved or cleared by the [place]  FDA and has been authorized for detection and/or diagnosis of SARS-CoV-2 by FDA under an Emergency Use Authorization (EUA). This EUA will remain in effect (meaning this test can be used) for the duration of the COVID-19 declaration under Section 564(b)(1) of the Act, 21 U.S.C. section 360bbb-3(b)(1), unless the authorization is terminated or revoked.  Performed at [facility], [address] [place], [place] [address]     Allergies: Patient has no known allergies.  Medications:  Facility Ordered Medications  Medication   acetaminophen  (TYLENOL ) tablet 650 mg   alum & mag hydroxide-simeth (MAALOX/MYLANTA) 200-200-20 MG/5ML suspension 30 mL   magnesium hydroxide (MILK OF MAGNESIA) suspension 30 mL   haloperidol (HALDOL) tablet 5 mg   And   diphenhydrAMINE (BENADRYL) capsule 50 mg   haloperidol lactate (HALDOL) injection 5 mg   And   diphenhydrAMINE (BENADRYL) injection 50 mg   And   LORazepam (ATIVAN) injection 2 mg   haloperidol lactate (HALDOL)  injection 10 mg   And   diphenhydrAMINE (BENADRYL) injection 50 mg   And   LORazepam (ATIVAN) injection 2 mg   traZODone (DESYREL) tablet 50 mg   thiamine (VITAMIN B1) injection 100 mg   [START ON 08/31/2024] thiamine (VITAMIN B1) tablet 100 mg   multivitamin with minerals tablet 1 tablet   LORazepam (ATIVAN) tablet 1 mg   hydrOXYzine (ATARAX) tablet 25 mg   loperamide (IMODIUM) capsule 2-4 mg   ondansetron  (ZOFRAN -ODT) disintegrating tablet 4 mg    Long Term Goals: Improvement in symptoms so as ready for discharge  Short Term Goals: Patient will verbalize feelings in meetings with treatment team members., Patient will attend at least of 50% of the groups daily., Pt will complete the PHQ9 on admission, day 3 and discharge., Patient will participate in completing the [place] Suicide Severity Rating Scale, Patient will score a low risk of violence for 24 hours prior to discharge, and Patient will take medications as prescribed daily.  Medical Decision Making  Monitor for signs of withdrawal -Continue Ativan 1mg  tabs every 6 hours PRN for CIWA >10 (recent scores 0,0) - Oral thiamine and MVI replacement - Abstinence from substances encouraged - SW to look into options for outpatient SA treatment at discharge   -Continue Trazodone 50 mg nightly prn for sleep  Recommendations  .recommending continuing admission to the [facility]  Donia Snell, NP 08/30/24  6:22 PM

## 2024-08-30 NOTE — Group Note (Signed)
 Group Topic: Communication  Group Date: 08/30/2024 Start Time: 1200 End Time: 1230 Facilitators: Reinhold Minor BROCKS, RN  Department: [facility]  Number of Participants: 9  Group Focus: communication Treatment Modality:  Psychoeducation Interventions utilized were clarification, patient education, and support Purpose: increase insight and reinforce self-care  Name: Shawn Arnold Date of Birth: Dec 27, 1985  MR: [id number]    Level of Participation: minimal Quality of Participation: cooperative Interactions with others: gave feedback Mood/Affect: appropriate Triggers (if applicable): na Cognition: insightful Progress: Moderate Response: feedback understanding of when to communicate needs Plan: patient will be encouraged to actively engage with staff  Patients Problems:  Patient Active Problem List   Diagnosis Date Noted   Alcohol  use disorder 08/30/2024   Elevated lipids 09/17/2022   Elevated blood pressure reading 08/21/2022   Dark urine 08/21/2022   Sensation of foreign body in skin 08/21/2022   Subcutaneous nodule of abdominal wall 11/07/2021   Right ear pain 11/07/2021   Health care maintenance 03/26/2018

## 2024-08-31 ENCOUNTER — Encounter (HOSPITAL_COMMUNITY): Payer: Self-pay

## 2024-08-31 DIAGNOSIS — Z560 Unemployment, unspecified: Secondary | ICD-10-CM | POA: Diagnosis not present

## 2024-08-31 DIAGNOSIS — Z5900 Homelessness unspecified: Secondary | ICD-10-CM | POA: Diagnosis not present

## 2024-08-31 DIAGNOSIS — F1010 Alcohol abuse, uncomplicated: Secondary | ICD-10-CM | POA: Diagnosis not present

## 2024-08-31 MED ORDER — MELATONIN 5 MG PO TABS: 5.0000 mg | ORAL_TABLET | Freq: Every day | ORAL | 0 refills | Status: DC

## 2024-08-31 MED ORDER — VITAMIN B-1 100 MG PO TABS: 100.0000 mg | ORAL_TABLET | Freq: Every day | ORAL | 0 refills | Status: DC

## 2024-08-31 MED ORDER — ADULT MULTIVITAMIN W/MINERALS CH
1.0000 | ORAL_TABLET | Freq: Every day | ORAL | 0 refills | Status: DC
Start: 2024-09-01 — End: 2024-09-01

## 2024-08-31 MED ORDER — DICYCLOMINE HCL 10 MG PO CAPS
10.0000 mg | ORAL_CAPSULE | Freq: Three times a day (TID) | ORAL | Status: DC
  Administered 2024-08-31 (×2): 10 mg via ORAL
  Filled 2024-08-31 (×2): qty 1
  Filled 2024-08-31: qty 28
  Filled 2024-08-31 (×2): qty 1

## 2024-08-31 MED ORDER — DICYCLOMINE HCL 10 MG PO CAPS: 10.0000 mg | ORAL_CAPSULE | Freq: Three times a day (TID) | ORAL | 0 refills | Status: DC

## 2024-08-31 MED ORDER — HYDROXYZINE HCL 25 MG PO TABS
25.0000 mg | ORAL_TABLET | Freq: Four times a day (QID) | ORAL | 0 refills | Status: DC | PRN
Start: 2024-08-31 — End: 2024-09-01

## 2024-08-31 MED ORDER — MELATONIN 5 MG PO TABS
5.0000 mg | ORAL_TABLET | Freq: Every day | ORAL | Status: DC
  Administered 2024-08-31: 5 mg via ORAL
  Filled 2024-08-31: qty 7
  Filled 2024-08-31: qty 1

## 2024-08-31 NOTE — ED Notes (Signed)
 Pt is in his room sleeping. No acute distress noted. Q15 safety checks in place per facility policy.

## 2024-08-31 NOTE — Group Note (Signed)
 Group Topic: Wellness  Group Date: 08/31/2024 Start Time: 1200 End Time: 1230 Facilitators: Daved Tinnie HERO, RN; Leron, Charolette CROME, RN; Herold Lajuana NOVAK, RN  Department: [facility]  Number of Participants: 11  Group Focus: nursing group Treatment Modality:  Psychoeducation Interventions utilized were patient education Purpose: relapse prevention strategies  Name: Shawn Arnold Date of Birth: [birth year]  MR: [id number]    Level of Participation: moderate Quality of Participation: cooperative Interactions with others: gave feedback Mood/Affect: appropriate Triggers (if applicable): n/a Cognition: coherent/clear Progress: Gaining insight Response: RN reviewed medications with pt, pt expressed understanding, further questions denied  Plan: patient will be encouraged to attend future RN educations groups, discuss medication questions with RN as needed.   Patients Problems:  Patient Active Problem List   Diagnosis Date Noted   Alcohol  use disorder 08/30/2024   Elevated lipids 09/17/2022   Elevated blood pressure reading 08/21/2022   Dark urine 08/21/2022   Sensation of foreign body in skin 08/21/2022   Subcutaneous nodule of abdominal wall 11/07/2021   Right ear pain 11/07/2021   Health care maintenance 03/26/2018

## 2024-08-31 NOTE — ED Notes (Signed)
 Patient asleep, CIWA assessment not done at this time. Will keep monitoring for safety.

## 2024-08-31 NOTE — ED Notes (Signed)
 Paitent provided dinner.

## 2024-08-31 NOTE — ED Notes (Signed)
 Paitent provided lunch.

## 2024-08-31 NOTE — ED Notes (Signed)
 Pt reports to feel 'alright' , c/o anxiety was given prn atarax. Pt c/o stomach cramps, provider notified. Pt ate breakfast,pt given prn zofran  for c/o mild nausea. Pt denied si hi and avh- verbal contract for safety provided. Medications reviewed, questions denied.

## 2024-08-31 NOTE — Group Note (Signed)
 Group Topic: Change and Accountability  Group Date: 08/31/2024 Start Time: 1325 End Time: 1357 Facilitators: Veverly Oddis BRAVO, NT  Department: [facility]  Number of Participants: 7  Group Focus: Personal Strengths Treatment Modality:  Cognitive Behavioral Therapy Interventions utilized were assignment Purpose: regain self-worth  Name: Shawn Arnold Date of Birth: 1985/11/29  MR: [id number]    Level of Participation: active Quality of Participation: engaged Interactions with others: gave feedback Mood/Affect: bright Triggers (if applicable): none Cognition: coherent/clear Progress: Moderate Response: One of my strengths is having great empathy for others but I need to work on having perseverance.  Plan: patient will be encouraged to think about what would help him have perseverance and encouraged to keep coming to group.  Patients Problems:  Patient Active Problem List   Diagnosis Date Noted   Alcohol  use disorder 08/30/2024   Elevated lipids 09/17/2022   Elevated blood pressure reading 08/21/2022   Dark urine 08/21/2022   Sensation of foreign body in skin 08/21/2022   Subcutaneous nodule of abdominal wall 11/07/2021   Right ear pain 11/07/2021   Health care maintenance 03/26/2018

## 2024-08-31 NOTE — Care Management (Signed)
 [facility] Care Management....   Writer met with patient to confirm discharge tomorrow 09/01/2024 for inpatient/residential treatment with [facility].  Patient reported looking forward to treatment.   Patient asked clinical research associate for address of [facility] to give to his wife  Clinical Research Associate provided patient with address and phone number

## 2024-08-31 NOTE — ED Notes (Signed)
 Patient is in the bedroom calm and composed, sleeping. NAD. Q15 checks in place, Will monitor for safety.

## 2024-08-31 NOTE — ED Provider Notes (Signed)
 Behavioral Health Progress Note  Date and Time: 08/31/2024 1:50 PM Name: Shawn Arnold MRN:  [id number] Shawn Arnold is a 38 y.o. male is a 38 yo African American male with a reported history of Alcohol  use d/o who presented to the [facility] on 11/02 with complaints of worsening alcohol  abuse and requesting detox, and referrals into a substance abuse rehabilitation program.   24 hr chart review: Vital signs within normal limits, no behavioral issues in the past 24 hours.  Patient is compliant with medications.  Reported abdominal cramping, no concerns reported from nursing.  No as needed medications in the past 24 hours.  Today's Patient assessment: On assessment today, the pt reports that their mood is euthymic, improved since admission, and stable. Denies feeling down, depressed, or sad.  Reports that anxiety symptoms are at manageable level.  Sleep is stable.  However, patient reports that he will benefit from having some melatonin 5 mg tonight for pain. Appetite is stable.  Concentration is without complaint.  Energy level is adequate. Denies having any suicidal thoughts. Denies having any suicidal intent and plan.  Denies having any HI.  Denies having psychotic symptoms.  Denies AVH, denies paranoia, denies delusional thinking, there are no overt signs of psychosis. Denies having side effects to current psychiatric medications.  Patient reports abdominal cramping, ordered Bentyl 10 TID mg x 2 days.  Denies any diarrhea today.  Reporting some mild generalized body pains.  Discussed discharge planning for tomorrow, and as per CSW: Patient is to be discharged to [facility] [place]  Patient has been accepted into their program.  Patient can discharge 09/01/2024 by 11 am   LC[facility] will pick up Wednesday 09/01/2024. 7 day supply and scripts of medications to be provided at discharge.   Diagnosis:  Final diagnoses:  Alcohol  use disorder    Total Time spent with patient: 45 minutes Sleep:  Fair  Appetite:  Fair  Current Medications:  Current Facility-Administered Medications  Medication Dose Route Frequency Provider Last Rate Last Admin   acetaminophen  (TYLENOL ) tablet 650 mg  650 mg Oral Q6H PRN Bobbitt, Shalon E, NP       alum & mag hydroxide-simeth (MAALOX/MYLANTA) 200-200-20 MG/5ML suspension 30 mL  30 mL Oral Q4H PRN Bobbitt, Shalon E, NP       dicyclomine (BENTYL) capsule 10 mg  10 mg Oral TID AC & HS Joshia Kitchings, NP   10 mg at 08/31/24 1145   haloperidol (HALDOL) tablet 5 mg  5 mg Oral TID PRN Bobbitt, Shalon E, NP       And   diphenhydrAMINE (BENADRYL) capsule 50 mg  50 mg Oral TID PRN Bobbitt, Shalon E, NP       haloperidol lactate (HALDOL) injection 5 mg  5 mg Intramuscular TID PRN Bobbitt, Shalon E, NP       And   diphenhydrAMINE (BENADRYL) injection 50 mg  50 mg Intramuscular TID PRN Bobbitt, Shalon E, NP       And   LORazepam (ATIVAN) injection 2 mg  2 mg Intramuscular TID PRN Bobbitt, Shalon E, NP       haloperidol lactate (HALDOL) injection 10 mg  10 mg Intramuscular TID PRN Bobbitt, Shalon E, NP       And   diphenhydrAMINE (BENADRYL) injection 50 mg  50 mg Intramuscular TID PRN Bobbitt, Shalon E, NP       And   LORazepam (ATIVAN) injection 2 mg  2 mg Intramuscular TID PRN Bobbitt, Shalon E, NP  hydrOXYzine (ATARAX) tablet 25 mg  25 mg Oral Q6H PRN Bobbitt, Shalon E, NP   25 mg at 08/31/24 0957   loperamide (IMODIUM) capsule 2-4 mg  2-4 mg Oral PRN Bobbitt, Shalon E, NP   2 mg at 08/31/24 0700   LORazepam (ATIVAN) tablet 1 mg  1 mg Oral Q6H PRN Bobbitt, Shalon E, NP       magnesium hydroxide (MILK OF MAGNESIA) suspension 30 mL  30 mL Oral Daily PRN Bobbitt, Shalon E, NP       melatonin tablet 5 mg  5 mg Oral QHS Jakorey Mcconathy, NP       multivitamin with minerals tablet 1 tablet  1 tablet Oral Daily Bobbitt, Shalon E, NP   1 tablet at 08/31/24 0955   ondansetron  (ZOFRAN -ODT) disintegrating tablet 4 mg  4 mg Oral Q6H PRN Bobbitt, Shalon E, NP   4 mg  at 08/31/24 0955   thiamine (VITAMIN B1) injection 100 mg  100 mg Intramuscular Once Bobbitt, Shalon E, NP       thiamine (VITAMIN B1) tablet 100 mg  100 mg Oral Daily Bobbitt, Shalon E, NP   100 mg at 08/31/24 0955   No current outpatient medications on file.    Labs  Lab Results:  Admission on 08/29/2024, Discharged on 08/30/2024  Component Date Value Ref Range Status   Sodium 08/29/2024 137  135 - 145 mmol/L Final   Potassium 08/29/2024 3.4 (L)  3.5 - 5.1 mmol/L Final   Chloride 08/29/2024 101  98 - 111 mmol/L Final   CO2 08/29/2024 22  22 - 32 mmol/L Final   Glucose, Bld 08/29/2024 68 (L)  70 - 99 mg/dL Final   Glucose reference range applies only to samples taken after fasting for at least 8 hours.   BUN 08/29/2024 10  6 - 20 mg/dL Final   Creatinine, Ser 08/29/2024 0.79  0.61 - 1.24 mg/dL Final   Calcium 88/97/7974 9.3  8.9 - 10.3 mg/dL Final   Total Protein 88/97/7974 6.7  6.5 - 8.1 g/dL Final   Albumin 88/97/7974 3.6  3.5 - 5.0 g/dL Final   AST 88/97/7974 20  15 - 41 U/L Final   ALT 08/29/2024 14  0 - 44 U/L Final   Alkaline Phosphatase 08/29/2024 48  38 - 126 U/L Final   Total Bilirubin 08/29/2024 0.6  0.0 - 1.2 mg/dL Final   GFR, Estimated 08/29/2024 >60  >60 mL/min Final   Comment: (NOTE) Calculated using the CKD-EPI Creatinine Equation (2021)    Anion gap 08/29/2024 14  5 - 15 Final   Performed at [facility], [address], [place], [place] [address]   WBC 08/29/2024 7.2  4.0 - 10.5 K/uL Final   RBC 08/29/2024 3.95 (L)  4.22 - 5.81 MIL/uL Final   Hemoglobin 08/29/2024 13.3  13.0 - 17.0 g/dL Final   HCT 88/97/7974 40.4  39.0 - 52.0 % Final   MCV 08/29/2024 102.3 (H)  80.0 - 100.0 fL Final   MCH 08/29/2024 33.7  26.0 - 34.0 pg Final   MCHC 08/29/2024 32.9  30.0 - 36.0 g/dL Final   RDW 88/97/7974 13.9  11.5 - 15.5 % Final   Platelets 08/29/2024 338  150 - 400 K/uL Final   nRBC 08/29/2024 0.0  0.0 - 0.2 % Final   Neutrophils Relative % 08/29/2024 50  % Final    Neutro Abs 08/29/2024 3.6  1.7 - 7.7 K/uL Final   Lymphocytes Relative 08/29/2024 38  %  Final   Lymphs Abs 08/29/2024 2.7  0.7 - 4.0 K/uL Final   Monocytes Relative 08/29/2024 8  % Final   Monocytes Absolute 08/29/2024 0.6  0.1 - 1.0 K/uL Final   Eosinophils Relative 08/29/2024 3  % Final   Eosinophils Absolute 08/29/2024 0.2  0.0 - 0.5 K/uL Final   Basophils Relative 08/29/2024 0  % Final   Basophils Absolute 08/29/2024 0.0  0.0 - 0.1 K/uL Final   Immature Granulocytes 08/29/2024 1  % Final   Abs Immature Granulocytes 08/29/2024 0.04  0.00 - 0.07 K/uL Final   Performed at [facility], [address], [place], [place] [address]   Cholesterol 08/29/2024 132  0 - 200 mg/dL Final   Triglycerides 88/97/7974 293 (H)  <150 mg/dL Final   HDL 88/97/7974 30 (L)  >40 mg/dL Final   Total CHOL/HDL Ratio 08/29/2024 4.4  RATIO Final   VLDL 08/29/2024 59 (H)  0 - 40 mg/dL Final   LDL Cholesterol 08/29/2024 43  0 - 99 mg/dL Final   Comment:        Total Cholesterol/HDL:CHD Risk Coronary Heart Disease Risk Table                     Men   Women  1/2 Average Risk   3.4   3.3  Average Risk       5.0   4.4  2 X Average Risk   9.6   7.1  3 X Average Risk  23.4   11.0        Use the calculated Patient Ratio above and the CHD Risk Table to determine the patient's CHD Risk.        ATP III CLASSIFICATION (LDL):  <100     mg/dL   Optimal  899-870  mg/dL   Near or Above                    Optimal  130-159  mg/dL   Borderline  839-810  mg/dL   High  >809     mg/dL   Very High Performed at [facility], [address], [place], [place] [address]    Alcohol , Ethyl (B) 08/29/2024 <15  <15 mg/dL Final   Comment: (NOTE) For medical purposes only. Performed at [facility], [address] [place], [place] [address]    POC Secobarbital LAYMOND) 08/30/2024 None Detected  NONE DETECTED (Cut Off Level 300 ng/mL) Final   POC Buprenorphine (BUP) 08/30/2024 None Detected  NONE DETECTED (Cut  Off Level 10 ng/mL) Final   POC Oxazepam (BZO) 08/30/2024 None Detected  NONE DETECTED (Cut Off Level 300 ng/mL) Final   POC Cocaine UR 08/30/2024 None Detected  NONE DETECTED (Cut Off Level 300 ng/mL) Final   POC Methamphetamine UR 08/30/2024 None Detected  NONE DETECTED (Cut Off Level 1000 ng/mL) Final   POC Morphine  08/30/2024 None Detected  NONE DETECTED (Cut Off Level 300 ng/mL) Final   POC Methadone UR 08/30/2024 None Detected  NONE DETECTED (Cut Off Level 300 ng/mL) Final   POC Oxycodone  UR 08/30/2024 None Detected  NONE DETECTED (Cut Off Level 100 ng/mL) Final   POC Marijuana UR 08/30/2024 Positive (A)  NONE DETECTED (Cut Off Level 50 ng/mL) Final  Admission on 06/11/2024, Discharged on 06/11/2024  Component Date Value Ref Range Status   Lipase 06/11/2024 36  11 - 51 U/L Final   Performed at [facility], [address]  [place], [place] [address]   Sodium 06/11/2024 141  135 - 145 mmol/L Final   Potassium 06/11/2024 4.1  3.5 - 5.1 mmol/L Final   Chloride 06/11/2024 104  98 - 111 mmol/L Final   CO2 06/11/2024 25  22 - 32 mmol/L Final   Glucose, Bld 06/11/2024 99  70 - 99 mg/dL Final   Glucose reference range applies only to samples taken after fasting for at least 8 hours.   BUN 06/11/2024 6  6 - 20 mg/dL Final   Creatinine, Ser 06/11/2024 0.98  0.61 - 1.24 mg/dL Final   Calcium 91/84/7974 9.1  8.9 - 10.3 mg/dL Final   Total Protein 91/84/7974 7.1  6.5 - 8.1 g/dL Final   Albumin 91/84/7974 4.3  3.5 - 5.0 g/dL Final   AST 91/84/7974 19  15 - 41 U/L Final   ALT 06/11/2024 6  0 - 44 U/L Final   Alkaline Phosphatase 06/11/2024 55  38 - 126 U/L Final   Total Bilirubin 06/11/2024 0.9  0.0 - 1.2 mg/dL Final   GFR, Estimated 06/11/2024 >60  >60 mL/min Final   Comment: (NOTE) Calculated using the CKD-EPI Creatinine Equation (2021)    Anion gap 06/11/2024 12  5 - 15 Final   Performed at [facility], [address] [place], [place] [address]   WBC 06/11/2024 8.4   4.0 - 10.5 K/uL Final   RBC 06/11/2024 4.58  4.22 - 5.81 MIL/uL Final   Hemoglobin 06/11/2024 15.1  13.0 - 17.0 g/dL Final   HCT 91/84/7974 44.5  39.0 - 52.0 % Final   MCV 06/11/2024 97.2  80.0 - 100.0 fL Final   MCH 06/11/2024 33.0  26.0 - 34.0 pg Final   MCHC 06/11/2024 33.9  30.0 - 36.0 g/dL Final   RDW 91/84/7974 12.5  11.5 - 15.5 % Final   Platelets 06/11/2024 304  150 - 400 K/uL Final   nRBC 06/11/2024 0.0  0.0 - 0.2 % Final   Performed at [facility], [address] [place], [place] [address]   Color, Urine 06/11/2024 YELLOW  YELLOW Final   APPearance 06/11/2024 CLEAR  CLEAR Final   Specific Gravity, Urine 06/11/2024 1.015  1.005 - 1.030 Final   pH 06/11/2024 7.5  5.0 - 8.0 Final   Glucose, UA 06/11/2024 NEGATIVE  NEGATIVE mg/dL Final   Hgb urine dipstick 06/11/2024 NEGATIVE  NEGATIVE Final   Bilirubin Urine 06/11/2024 SMALL (A)  NEGATIVE Final   Ketones, ur 06/11/2024 NEGATIVE  NEGATIVE mg/dL Final   Protein, ur 91/84/7974 30 (A)  NEGATIVE mg/dL Final   Nitrite 91/84/7974 NEGATIVE  NEGATIVE Final   Leukocytes,Ua 06/11/2024 NEGATIVE  NEGATIVE Final   Performed at [facility], [address] [place], [place] [address]   RBC / HPF 06/11/2024 NONE SEEN  0 - 5 RBC/hpf Final   WBC, UA 06/11/2024 0-5  0 - 5 WBC/hpf Final   Bacteria, UA 06/11/2024 RARE (A)  NONE SEEN Final   Squamous Epithelial / HPF 06/11/2024 0-5  0 - 5 /HPF Final   Mucus 06/11/2024 PRESENT   Final   Hyaline Casts, UA 06/11/2024 PRESENT   Final   Performed at [facility], [address] [place], [place] [address]   SARS Coronavirus 2 by RT PCR 06/11/2024 NEGATIVE  NEGATIVE Final   Comment: (NOTE) SARS-CoV-2 target nucleic acids are NOT DETECTED.  The SARS-CoV-2 RNA is generally detectable in upper respiratory specimens during the  acute phase of infection. The lowest concentration of SARS-CoV-2 viral copies this assay can detect is 138 copies/mL. A negative result does not  preclude SARS-Cov-2 infection and should not be used as the sole basis for treatment or other patient management decisions. A negative result may occur with  improper specimen collection/handling, submission of specimen other than nasopharyngeal swab, presence of viral mutation(s) within the areas targeted by this assay, and inadequate number of viral copies(<138 copies/mL). A negative result must be combined with clinical observations, patient history, and epidemiological information. The expected result is Negative.  Fact Sheet for Patients:  bloggercourse.com  Fact Sheet for Healthcare Providers:  seriousbroker.it  This test is no                          t yet approved or cleared by the [place]  FDA and  has been authorized for detection and/or diagnosis of SARS-CoV-2 by FDA under an Emergency Use Authorization (EUA). This EUA will remain  in effect (meaning this test can be used) for the duration of the COVID-19 declaration under Section 564(b)(1) of the Act, 21 U.S.C.section 360bbb-3(b)(1), unless the authorization is terminated  or revoked sooner.       Influenza A by PCR 06/11/2024 NEGATIVE  NEGATIVE Final   Influenza B by PCR 06/11/2024 NEGATIVE  NEGATIVE Final   Comment: (NOTE) The Xpert Xpress SARS-CoV-2/FLU/RSV plus assay is intended as an aid in the diagnosis of influenza from Nasopharyngeal swab specimens and should not be used as a sole basis for treatment. Nasal washings and aspirates are unacceptable for Xpert Xpress SARS-CoV-2/FLU/RSV testing.  Fact Sheet for Patients: bloggercourse.com  Fact Sheet for Healthcare Providers: seriousbroker.it  This test is not yet approved or cleared by the [place]  FDA and has been authorized for detection and/or diagnosis of SARS-CoV-2 by FDA under an Emergency Use Authorization (EUA). This EUA will remain in  effect (meaning this test can be used) for the duration of the COVID-19 declaration under Section 564(b)(1) of the Act, 21 U.S.C. section 360bbb-3(b)(1), unless the authorization is terminated or revoked.     Resp Syncytial Virus by PCR 06/11/2024 NEGATIVE  NEGATIVE Final   Comment: (NOTE) Fact Sheet for Patients: bloggercourse.com  Fact Sheet for Healthcare Providers: seriousbroker.it  This test is not yet approved or cleared by the [place]  FDA and has been authorized for detection and/or diagnosis of SARS-CoV-2 by FDA under an Emergency Use Authorization (EUA). This EUA will remain in effect (meaning this test can be used) for the duration of the COVID-19 declaration under Section 564(b)(1) of the Act, 21 U.S.C. section 360bbb-3(b)(1), unless the authorization is terminated or revoked.  Performed at [facility], [address] [place], [place] [address]     Blood Alcohol  level:  Lab Results  Component Value Date   [facility] <15 08/29/2024    Metabolic Disorder Labs: Lab Results  Component Value Date   HGBA1C 4.8 03/26/2018   No results found for: PROLACTIN Lab Results  Component Value Date   CHOL 132 08/29/2024   TRIG 293 (H) 08/29/2024   HDL 30 (L) 08/29/2024   CHOLHDL 4.4 08/29/2024   VLDL 59 (H) 08/29/2024   LDLCALC 43 08/29/2024   LDLCALC 117 (H) 08/21/2022    Therapeutic Lab Levels: No results found for: LITHIUM No results found for: VALPROATE No results found for: CBMZ  Physical Findings   AIMS    Flowsheet Row ED from 08/30/2024 in  [facility]  AIMS Total Score 0   GAD-7    Flowsheet Row ED from 08/30/2024 in [facility]  Total GAD-7 Score 4   PHQ2-9    Flowsheet Row ED from 08/30/2024 in [facility] Office Visit from [facility]21/2022 [facility]  PHQ-2 Total Score 6 3   PHQ-9 Total Score 15 10   Flowsheet Row ED from 08/30/2024 in [facility] ED from 08/29/2024 in [facility] ED from 06/11/2024 in [facility] Emergency Department at [facility]  C-SSRS RISK CATEGORY No Risk No Risk No Risk     Musculoskeletal  Strength & Muscle Tone: within normal limits Gait & Station: normal Patient leans: N/A  Psychiatric Specialty Exam  Presentation  General Appearance:  Casual; Well Groomed  Eye Contact: Fair  Speech: Clear and Coherent  Speech Volume: Normal  Handedness: Right   Mood and Affect  Mood: Depressed  Affect: Congruent   Thought Process  Thought Processes: Coherent  Descriptions of Associations:Intact  Orientation:Full (Time, Place and Person)  Thought Content:Logical  Diagnosis of Schizophrenia or Schizoaffective disorder in past: No    Hallucinations:Hallucinations: None  Ideas of Reference:None  Suicidal Thoughts:Suicidal Thoughts: No  Homicidal Thoughts:Homicidal Thoughts: No   Sensorium  Memory: Immediate Fair  Judgment: Fair  Insight: Fair   Art Therapist  Concentration: Fair  Attention Span: Fair  Recall: Fair  Fund of Knowledge: Fair  Language: Fair   Psychomotor Activity  Psychomotor Activity: Psychomotor Activity: Decreased   Assets  Assets: Communication Skills   Sleep  Sleep: Sleep: Fair  Estimated Sleeping Duration (Last 24 Hours): 11.25-12.25 hours (Due to Daylight Saving Time, the durations displayed may not accurately represent documentation during the time change interval)  Nutritional Assessment (For OBS and FBC admissions only) Has the patient had a weight loss or gain of 10 pounds or more in the last 3 months?: No Has the patient had a decrease in food intake/or appetite?: No Does the patient have dental problems?: No Does the patient have eating habits or behaviors that may be  indicators of an eating disorder including binging or inducing vomiting?: No Has the patient recently lost weight without trying?: 0 Has the patient been eating poorly because of a decreased appetite?: 0 Malnutrition Screening Tool Score: 0    Physical Exam  Physical Exam Constitutional:      Appearance: Normal appearance.  Musculoskeletal:     Cervical back: Normal range of motion.  Neurological:     General: No focal deficit present.     Mental Status: [name] and oriented to person, place, and time.    Review of Systems  Constitutional: Negative.   Psychiatric/Behavioral:  Positive for depression and substance abuse. Negative for hallucinations, memory loss and suicidal ideas. The patient is nervous/anxious and has insomnia.   All other systems reviewed and are negative.  Blood pressure 125/84, pulse 67, temperature 98.6 F (37 C), temperature source Oral, resp. rate 17, SpO2 100%. There is no height or weight on file to calculate BMI.  Treatment Plan Summary: Daily contact with patient to assess and evaluate symptoms and progress in treatment and Medication management Medical Decision Making  Monitor for signs of withdrawal -Start Melatonin 5 mg nightly for sleep -Continue Ativan 1mg  tabs every 6 hours PRN for CIWA >10 (recent scores 0,0) - Oral thiamine and MVI replacement - Abstinence from substances encouraged - SW  to look into options for outpatient SA treatment at discharge   -Discontinue Trazodone 50 mg nightly prn for sleep per pt's request   Recommendations  .recommending continuing admission to the [facility]   Donia Snell, NP 08/30/24  6:22 PM    Donia Snell, NP 08/31/2024 1:50 PM

## 2024-08-31 NOTE — Group Note (Signed)
 Group Topic: Social Support  Group Date: 08/31/2024 Start Time: 1945 End Time: 2015 Facilitators: Joan Plowman B  Department: [facility]  Number of Participants: 12  Group Focus: check in Treatment Modality:  Individual Therapy Interventions utilized were leisure development Purpose: express feelings  Name: Shawn Arnold Date of Birth: 1986-10-21  MR: [id number]    Level of Participation: active Quality of Participation: attentive and cooperative Interactions with others: gave feedback Mood/Affect: appropriate Triggers (if applicable): NA Cognition: coherent/clear Progress: Gaining insight Response: NA Plan: patient will be encouraged to keep going to groups  Patients Problems:  Patient Active Problem List   Diagnosis Date Noted   Alcohol  use disorder 08/30/2024   Elevated lipids 09/17/2022   Elevated blood pressure reading 08/21/2022   Dark urine 08/21/2022   Sensation of foreign body in skin 08/21/2022   Subcutaneous nodule of abdominal wall 11/07/2021   Right ear pain 11/07/2021   Health care maintenance 03/26/2018

## 2024-08-31 NOTE — ED Notes (Signed)
 Pt reports that previous prn atarax 'took the edge off' of anxiety reported this morning. Pt reports that zofran  was effective in relieving nausea.

## 2024-08-31 NOTE — ED Notes (Signed)
 Pt was observed in the dayroom when writer arrived at the unit. During assessment, pt denied SI/HI/AVH. .Q15 safety checks in place.

## 2024-08-31 NOTE — ED Notes (Signed)
 Paitent provided breakfast.

## 2024-08-31 NOTE — ED Notes (Signed)
 Patient is in the bedroom calm and sleeping. NAD. Q15 checks in place, Will monitor for safety.

## 2024-08-31 NOTE — ED Notes (Signed)
 Pt calm and cooperative. Pt currently attending visit in day room.

## 2024-09-01 DIAGNOSIS — F1010 Alcohol abuse, uncomplicated: Secondary | ICD-10-CM | POA: Diagnosis not present

## 2024-09-01 DIAGNOSIS — Z5900 Homelessness unspecified: Secondary | ICD-10-CM | POA: Diagnosis not present

## 2024-09-01 DIAGNOSIS — Z560 Unemployment, unspecified: Secondary | ICD-10-CM | POA: Diagnosis not present

## 2024-09-01 MED ORDER — MELATONIN 5 MG PO TABS: 5.0000 mg | ORAL_TABLET | Freq: Every day | ORAL | 0 refills | Status: DC

## 2024-09-01 MED ORDER — ADULT MULTIVITAMIN W/MINERALS CH
1.0000 | ORAL_TABLET | Freq: Every day | ORAL | 0 refills | Status: DC
Start: 2024-09-01 — End: 2024-09-01

## 2024-09-01 MED ORDER — HYDROXYZINE HCL 25 MG PO TABS: 25.0000 mg | ORAL_TABLET | Freq: Four times a day (QID) | ORAL | 0 refills | Status: DC | PRN

## 2024-09-01 MED ORDER — DICYCLOMINE HCL 10 MG PO CAPS: 10.0000 mg | ORAL_CAPSULE | Freq: Three times a day (TID) | ORAL | 0 refills | Status: DC

## 2024-09-01 MED ORDER — MULTI-VITAMIN/MINERALS PO TABS: 1.0000 | ORAL_TABLET | Freq: Every day | ORAL | Status: AC

## 2024-09-01 MED ORDER — MELATONIN 5 MG PO TABS: 5.0000 mg | ORAL_TABLET | Freq: Every day | ORAL | Status: DC

## 2024-09-01 MED ORDER — MELATONIN 5 MG PO TABS: 5.0000 mg | ORAL_TABLET | Freq: Every day | ORAL | 0 refills | Status: AC

## 2024-09-01 MED ORDER — HYDROXYZINE HCL 10 MG PO TABS: 10.0000 mg | ORAL_TABLET | Freq: Three times a day (TID) | ORAL | 0 refills | Status: AC | PRN

## 2024-09-01 MED ORDER — VITAMIN B-1 100 MG PO TABS: 100.0000 mg | ORAL_TABLET | Freq: Every day | ORAL | 0 refills | Status: DC

## 2024-09-01 NOTE — ED Notes (Signed)
 PRN atarax  given due to patient reports of anxiety. Medication administered with no complications. Environment secured, safety checks in place per facility policy.

## 2024-09-01 NOTE — ED Provider Notes (Incomplete)
 FBC/OBS ASAP Discharge Summary  Date and Time: 09/01/2024 9:17 AM  Name: Shawn Arnold  MRN:  [id number]   Discharge Diagnoses:  Final diagnoses:  Alcohol  use disorder   HPI: Shawn Arnold is a 38 y.o. male is a 38 yo African American male with a reported history of Alcohol  use d/o who presented to the [facility] on 11/02 with complaints of worsening alcohol  abuse and requesting detox, and referrals into a substance abuse rehabilitation program.   Stay Summary: Patient started on Ativan taper for the physiological symptoms of alcohol  withdrawal, and taper discontinued after 2 days, and an Ativan 1 mg to be given PRN if  CIWAs scores are >10 ordered. Scores never reached 10 at time of discharge. Patient assessed prior to discharge, and    Total Time spent with patient: {Time; 15 min - 8 hours:17441}  Past Psychiatric History: *** Past Medical History: *** Family History: *** Family Psychiatric History: *** Social History: *** Tobacco Cessation:  {Discharge tobacco cessation prescription:304700209}  Current Medications:  Current Facility-Administered Medications  Medication Dose Route Frequency Provider Last Rate Last Admin   acetaminophen  (TYLENOL ) tablet 650 mg  650 mg Oral Q6H PRN Bobbitt, Shalon E, NP       alum & mag hydroxide-simeth (MAALOX/MYLANTA) 200-200-20 MG/5ML suspension 30 mL  30 mL Oral Q4H PRN Bobbitt, Shalon E, NP       dicyclomine (BENTYL) capsule 10 mg  10 mg Oral TID AC & HS Isabeau Mccalla, NP   10 mg at 08/31/24 2137   haloperidol (HALDOL) tablet 5 mg  5 mg Oral TID PRN Bobbitt, Shalon E, NP       And   diphenhydrAMINE (BENADRYL) capsule 50 mg  50 mg Oral TID PRN Bobbitt, Shalon E, NP       haloperidol lactate (HALDOL) injection 5 mg  5 mg Intramuscular TID PRN Bobbitt, Shalon E, NP       And   diphenhydrAMINE (BENADRYL) injection 50 mg  50 mg Intramuscular TID PRN Bobbitt, Shalon E, NP       And   LORazepam (ATIVAN) injection 2 mg  2 mg Intramuscular TID PRN Bobbitt,  Shalon E, NP       haloperidol lactate (HALDOL) injection 10 mg  10 mg Intramuscular TID PRN Bobbitt, Shalon E, NP       And   diphenhydrAMINE (BENADRYL) injection 50 mg  50 mg Intramuscular TID PRN Bobbitt, Shalon E, NP       And   LORazepam (ATIVAN) injection 2 mg  2 mg Intramuscular TID PRN Bobbitt, Shalon E, NP       hydrOXYzine (ATARAX) tablet 25 mg  25 mg Oral Q6H PRN Bobbitt, Shalon E, NP   25 mg at 08/31/24 0957   loperamide (IMODIUM) capsule 2-4 mg  2-4 mg Oral PRN Bobbitt, Shalon E, NP   2 mg at 08/31/24 0700   LORazepam (ATIVAN) tablet 1 mg  1 mg Oral Q6H PRN Bobbitt, Shalon E, NP       magnesium hydroxide (MILK OF MAGNESIA) suspension 30 mL  30 mL Oral Daily PRN Bobbitt, Shalon E, NP       melatonin tablet 5 mg  5 mg Oral QHS [place]ula Grindle, NP   5 mg at 08/31/24 2136   multivitamin with minerals tablet 1 tablet  1 tablet Oral Daily Bobbitt, Shalon E, NP   1 tablet at 08/31/24 0955   ondansetron  (ZOFRAN -ODT) disintegrating tablet 4 mg  4 mg Oral Q6H PRN Bobbitt, Shalon E,  NP   4 mg at 08/31/24 0955   thiamine (VITAMIN B1) injection 100 mg  100 mg Intramuscular Once Bobbitt, Shalon E, NP       thiamine (VITAMIN B1) tablet 100 mg  100 mg Oral Daily Bobbitt, Shalon E, NP   100 mg at 08/31/24 9044   Current Outpatient Medications  Medication Sig Dispense Refill   dicyclomine (BENTYL) 10 MG capsule Take 1 capsule (10 mg total) by mouth 4 (four) times daily -  before meals and at bedtime. 4 capsule 0   hydrOXYzine (ATARAX) 25 MG tablet Take 1 tablet (25 mg total) by mouth every 6 (six) hours as needed. 30 tablet 0   melatonin 5 MG TABS Take 1 tablet (5 mg total) by mouth at bedtime. 30 tablet 0   Multiple Vitamin (MULTIVITAMIN WITH MINERALS) TABS tablet Take 1 tablet by mouth daily. 30 tablet 0   thiamine (VITAMIN B-1) 100 MG tablet Take 1 tablet (100 mg total) by mouth daily. 30 tablet 0    PTA Medications:  Facility Ordered Medications  Medication   acetaminophen  (TYLENOL ) tablet  650 mg   alum & mag hydroxide-simeth (MAALOX/MYLANTA) 200-200-20 MG/5ML suspension 30 mL   magnesium hydroxide (MILK OF MAGNESIA) suspension 30 mL   haloperidol (HALDOL) tablet 5 mg   And   diphenhydrAMINE (BENADRYL) capsule 50 mg   haloperidol lactate (HALDOL) injection 5 mg   And   diphenhydrAMINE (BENADRYL) injection 50 mg   And   LORazepam (ATIVAN) injection 2 mg   haloperidol lactate (HALDOL) injection 10 mg   And   diphenhydrAMINE (BENADRYL) injection 50 mg   And   LORazepam (ATIVAN) injection 2 mg   thiamine (VITAMIN B1) injection 100 mg   thiamine (VITAMIN B1) tablet 100 mg   multivitamin with minerals tablet 1 tablet   LORazepam (ATIVAN) tablet 1 mg   hydrOXYzine (ATARAX) tablet 25 mg   loperamide (IMODIUM) capsule 2-4 mg   ondansetron  (ZOFRAN -ODT) disintegrating tablet 4 mg   dicyclomine (BENTYL) capsule 10 mg   melatonin tablet 5 mg   PTA Medications  Medication Sig   dicyclomine (BENTYL) 10 MG capsule Take 1 capsule (10 mg total) by mouth 4 (four) times daily -  before meals and at bedtime.   melatonin 5 MG TABS Take 1 tablet (5 mg total) by mouth at bedtime.   Multiple Vitamin (MULTIVITAMIN WITH MINERALS) TABS tablet Take 1 tablet by mouth daily.   hydrOXYzine (ATARAX) 25 MG tablet Take 1 tablet (25 mg total) by mouth every 6 (six) hours as needed.   thiamine (VITAMIN B-1) 100 MG tablet Take 1 tablet (100 mg total) by mouth daily.       09/01/2024    8:42 AM 08/30/2024   12:21 PM 08/21/2022   11:45 AM  Depression screen PHQ 2/9  Decreased Interest 0 3 0  Down, Depressed, Hopeless 0 3 3  PHQ - 2 Score 0 6 3  Altered sleeping 0 2 0  Tired, decreased energy 0 3 3  Change in appetite 0 1 3  Feeling bad or failure about yourself  0 2 1  Trouble concentrating 0 1 0  Moving slowly or fidgety/restless 0 0 0  Suicidal thoughts 0 0 0  PHQ-9 Score 0 15 10  Difficult doing work/chores Not difficult at all Somewhat difficult     Flowsheet Row ED from 08/30/2024 in  [facility] ED from 08/29/2024 in [facility] ED from 06/11/2024 in [facility]  C-SSRS RISK CATEGORY No Risk No Risk No Risk    Musculoskeletal  Strength & Muscle Tone: {desc; muscle tone:32375} Gait & Station: {PE GAIT ED WJUO:77474} Patient leans: {Patient Leans:21022755}  Psychiatric Specialty Exam  Presentation  General Appearance:  Casual; Well Groomed  Eye Contact: Fair  Speech: Clear and Coherent  Speech Volume: Normal  Handedness: Right   Mood and Affect  Mood: Depressed  Affect: Congruent   Thought Process  Thought Processes: Coherent  Descriptions of Associations:Intact  Orientation:Full (Time, Place and Person)  Thought Content:Logical  Diagnosis of Schizophrenia or Schizoaffective disorder in past: No    Hallucinations:Hallucinations: None  Ideas of Reference:None  Suicidal Thoughts:Suicidal Thoughts: No  Homicidal Thoughts:Homicidal Thoughts: No   Sensorium  Memory: Immediate Fair  Judgment: Fair  Insight: Fair   Art Therapist  Concentration: Fair  Attention Span: Fair  Recall: Fair  Fund of Knowledge: Fair  Language: Fair   Psychomotor Activity  Psychomotor Activity: Psychomotor Activity: Decreased   Assets  Assets: Communication Skills   Sleep  Sleep: Sleep: Fair  Estimated Sleeping Duration (Last 24 Hours): 8.75-10.00 hours (Due to [employer] Time, the durations displayed may not accurately represent documentation during the time change interval)  Nutritional Assessment (For OBS and FBC admissions only) Has the patient had a weight loss or gain of 10 pounds or more in the last 3 months?: No Has the patient had a decrease in food intake/or appetite?: No Does the patient have dental problems?: No Does the patient have eating habits or behaviors that may be indicators of an eating disorder  including binging or inducing vomiting?: No Has the patient recently lost weight without trying?: 0 Has the patient been eating poorly because of a decreased appetite?: 0 Malnutrition Screening Tool Score: 0    Physical Exam  Physical Exam ROS Blood pressure 125/80, pulse 73, temperature 98.6 F (37 C), temperature source Oral, resp. rate 17, SpO2 97%. There is no height or weight on file to calculate BMI.  Demographic Factors:  {Demographic Factors:20662}  Loss Factors: {Loss Factors:20659}  Historical Factors: {Historical Factors:20660}  Risk Reduction Factors:   {Risk Reduction Factors:20661}  Continued Clinical Symptoms:  {Clinical Factors:22706}  Cognitive Features That Contribute To Risk:  {chl bhh Cognitive Features:304700251}    Suicide Risk:  {[facility] SUICIDE MPDX:77295}  Plan Of Care/Follow-up recommendations:  {[facility] DC FU RECOMMENDATIONS:22620}  Disposition: ***  Donia Snell, NP 09/01/2024, 9:17 AM

## 2024-09-01 NOTE — Group Note (Signed)
 Group Topic: Social Support  Group Date: 09/01/2024 Start Time: 1000 End Time: 1100 Facilitators: Elnor Keven SAILOR  Department: [facility]  Number of Participants: 9  Group Focus: suicide prevention skills Treatment Modality:  Psychoeducation Interventions utilized were patient education Purpose: enhance coping skills  Name: Shawn Arnold Date of Birth: 1986/03/19  MR: [id number]    Level of Participation: moderate Quality of Participation: attentive and cooperative Interactions with others: gave feedback Mood/Affect: appropriate Triggers (if applicable): NA Cognition: coherent/clear Progress: Moderate Response: Pt shared about his history of alcoholism. Pt has a goal of doing better everyday. Pt listened to discussion of important of verbiage around mental health and education of loved ones.  Plan: follow-up needed  Patients Problems:  Patient Active Problem List   Diagnosis Date Noted   Alcohol  use disorder 08/30/2024   Elevated lipids 09/17/2022   Elevated blood pressure reading 08/21/2022   Dark urine 08/21/2022   Sensation of foreign body in skin 08/21/2022   Subcutaneous nodule of abdominal wall 11/07/2021   Right ear pain 11/07/2021   Health care maintenance 03/26/2018

## 2024-09-01 NOTE — ED Notes (Signed)
 Patient discharged to [facility] per provider order. After Visit Summary (AVS) printed and given to patient, as well as printed prescriptions and medication samples. AVS reviewed with patient and all questions fully answered. Patient discharged in no acute distress, A& O x4 and ambulatory. Patient denied SI/HI, A/VH upon discharge. Patient verbalized understanding of all discharge instructions explained by staff, including follow up appointments, RX's and safety plan. Patient mood fair. Patient belongings returned to patient from locker # 21 complete and intact. Patient escorted to lobby via staff for transport to destination. Safety maintained.

## 2024-09-01 NOTE — ED Notes (Signed)
 Patient alert & oriented x4. Denies intent to harm self or others when asked. Denies A/VH. Patient denies any physical complaints when asked. No acute distress noted. Support and encouragement provided. Patient observed in milieu. No inappropriate behaviors observed or reported. Routine safety checks conducted per facility protocol. Encouraged patient to notify staff if any thoughts of harm towards self or others arise. Patient verbalizes understanding and agreement.

## 2024-09-01 NOTE — ED Notes (Signed)
 Patient sitting in dayroom interacting with peers. No acute distress noted. No concerns voiced. No inappropriate behaviors observed or reported at this time. Informed patient to notify staff with any needs or assistance. Patient verbalized understanding or agreement. Safety checks in place per facility policy.

## 2024-09-01 NOTE — ED Notes (Signed)
 Patient sitting in dayroom, calm and composed. No acute distress noted. No concerns voiced. No inappropriate behaviors observed or reported at this time. Informed patient to notify staff with any needs or assistance. Patient verbalized understanding or agreement. Safety checks in place per facility policy.

## 2024-09-01 NOTE — ED Notes (Signed)
 Pt is sleeping at the moment. No acute distress noted. Respirations are even and labored. Q 15 safety checks in place per facility policy.
